# Patient Record
Sex: Male | Born: 1935 | Race: White | Hispanic: No | Marital: Single | Smoking: Never smoker
Health system: Southern US, Community
[De-identification: ages and names within clinical notes are randomized; demographics above are authoritative.]

## PROBLEM LIST (undated history)

## (undated) DIAGNOSIS — I10 Essential (primary) hypertension: Secondary | ICD-10-CM

## (undated) DIAGNOSIS — E785 Hyperlipidemia, unspecified: Secondary | ICD-10-CM

## (undated) DIAGNOSIS — M419 Scoliosis, unspecified: Secondary | ICD-10-CM

## (undated) DIAGNOSIS — K635 Polyp of colon: Secondary | ICD-10-CM

## (undated) HISTORY — DX: Polyp of colon: K63.5

## (undated) HISTORY — DX: Hyperlipidemia, unspecified: E78.5

## (undated) HISTORY — PX: HIATAL HERNIA REPAIR: SHX195

## (undated) HISTORY — DX: Scoliosis, unspecified: M41.9

## (undated) HISTORY — PX: APPENDECTOMY: SHX54

## (undated) HISTORY — PX: CATARACT EXTRACTION: SUR2

## (undated) HISTORY — DX: Essential (primary) hypertension: I10

---

## 1968-04-08 HISTORY — PX: INGUINAL HERNIA REPAIR: SHX194

## 1999-01-17 ENCOUNTER — Encounter (INDEPENDENT_AMBULATORY_CARE_PROVIDER_SITE_OTHER): Payer: Self-pay | Admitting: Specialist

## 1999-01-17 ENCOUNTER — Other Ambulatory Visit: Admission: RE | Admit: 1999-01-17 | Discharge: 1999-01-17 | Payer: Self-pay | Admitting: Gastroenterology

## 2004-11-08 ENCOUNTER — Ambulatory Visit: Payer: Self-pay | Admitting: Internal Medicine

## 2005-02-15 ENCOUNTER — Ambulatory Visit: Payer: Self-pay | Admitting: Internal Medicine

## 2005-03-19 ENCOUNTER — Ambulatory Visit: Payer: Self-pay | Admitting: Internal Medicine

## 2005-06-06 ENCOUNTER — Ambulatory Visit: Payer: Self-pay | Admitting: Internal Medicine

## 2005-11-11 ENCOUNTER — Ambulatory Visit: Payer: Self-pay | Admitting: Internal Medicine

## 2006-12-15 ENCOUNTER — Ambulatory Visit: Payer: Self-pay | Admitting: Internal Medicine

## 2006-12-15 LAB — CONVERTED CEMR LAB
CO2: 29 meq/L (ref 19–32)
Chloride: 109 meq/L (ref 96–112)
Eosinophils Absolute: 0.1 10*3/uL (ref 0.0–0.6)
Eosinophils Relative: 1.5 % (ref 0.0–5.0)
GFR calc non Af Amer: 88 mL/min
Glucose, Bld: 112 mg/dL — ABNORMAL HIGH (ref 70–99)
HCT: 43.3 % (ref 39.0–52.0)
Lymphocytes Relative: 21.9 % (ref 12.0–46.0)
MCV: 88.8 fL (ref 78.0–100.0)
Neutrophils Relative %: 65.8 % (ref 43.0–77.0)
RBC: 4.88 M/uL (ref 4.22–5.81)
Sodium: 144 meq/L (ref 135–145)
Total CHOL/HDL Ratio: 2.5
WBC: 6.6 10*3/uL (ref 4.5–10.5)

## 2008-01-21 ENCOUNTER — Ambulatory Visit: Payer: Self-pay | Admitting: Internal Medicine

## 2008-01-21 DIAGNOSIS — I10 Essential (primary) hypertension: Secondary | ICD-10-CM

## 2008-01-21 DIAGNOSIS — Z860100 Personal history of colon polyps, unspecified: Secondary | ICD-10-CM | POA: Insufficient documentation

## 2008-01-21 DIAGNOSIS — E785 Hyperlipidemia, unspecified: Secondary | ICD-10-CM | POA: Insufficient documentation

## 2008-01-21 DIAGNOSIS — M4120 Other idiopathic scoliosis, site unspecified: Secondary | ICD-10-CM | POA: Insufficient documentation

## 2008-01-28 ENCOUNTER — Telehealth (INDEPENDENT_AMBULATORY_CARE_PROVIDER_SITE_OTHER): Payer: Self-pay | Admitting: *Deleted

## 2008-01-29 LAB — CONVERTED CEMR LAB: Vit D, 1,25-Dihydroxy: 18 — ABNORMAL LOW (ref 30–89)

## 2008-06-01 ENCOUNTER — Encounter (INDEPENDENT_AMBULATORY_CARE_PROVIDER_SITE_OTHER): Payer: Self-pay | Admitting: *Deleted

## 2008-06-17 ENCOUNTER — Ambulatory Visit: Payer: Self-pay | Admitting: Gastroenterology

## 2008-07-07 ENCOUNTER — Ambulatory Visit: Payer: Self-pay | Admitting: Gastroenterology

## 2008-07-07 ENCOUNTER — Encounter: Payer: Self-pay | Admitting: Gastroenterology

## 2008-07-11 ENCOUNTER — Encounter: Payer: Self-pay | Admitting: Gastroenterology

## 2009-01-20 ENCOUNTER — Ambulatory Visit: Payer: Self-pay | Admitting: Internal Medicine

## 2009-01-20 DIAGNOSIS — R9431 Abnormal electrocardiogram [ECG] [EKG]: Secondary | ICD-10-CM

## 2009-01-20 LAB — CONVERTED CEMR LAB
AST: 19 units/L (ref 0–37)
Albumin: 4.1 g/dL (ref 3.5–5.2)
Alkaline Phosphatase: 64 units/L (ref 39–117)
Basophils Absolute: 0.1 10*3/uL (ref 0.0–0.1)
Bilirubin, Direct: 0.1 mg/dL (ref 0.0–0.3)
CO2: 28 meq/L (ref 19–32)
CRP, High Sensitivity: 4.2 (ref 0.00–5.00)
Calcium: 8.7 mg/dL (ref 8.4–10.5)
Creatinine, Ser: 1 mg/dL (ref 0.4–1.5)
Eosinophils Absolute: 0.1 10*3/uL (ref 0.0–0.7)
GFR calc non Af Amer: 77.31 mL/min (ref >60)
Glucose, Bld: 112 mg/dL — ABNORMAL HIGH (ref 70–99)
HDL: 46.4 mg/dL (ref >39.00)
Hemoglobin: 15.3 g/dL (ref 13.0–17.0)
Ketones, ur: NEGATIVE mg/dL
Lymphocytes Relative: 18.6 % (ref 12.0–46.0)
MCHC: 34.6 g/dL (ref 30.0–36.0)
Monocytes Relative: 6.7 % (ref 3.0–12.0)
Neutro Abs: 4.4 10*3/uL (ref 1.4–7.7)
Neutrophils Relative %: 71.7 % (ref 43.0–77.0)
RDW: 12.7 % (ref 11.5–14.6)
Sodium: 140 meq/L (ref 135–145)
Specific Gravity, Urine: 1.025 (ref 1.000–1.030)
Total Bilirubin: 0.7 mg/dL (ref 0.3–1.2)
Total CHOL/HDL Ratio: 3
Total Protein, Urine: NEGATIVE mg/dL
Triglycerides: 93 mg/dL (ref 0.0–149.0)
Urine Glucose: NEGATIVE mg/dL
Urobilinogen, UA: 0.2 (ref 0.0–1.0)
VLDL: 18.6 mg/dL (ref 0.0–40.0)
pH: 6 (ref 5.0–8.0)

## 2009-02-06 ENCOUNTER — Ambulatory Visit (HOSPITAL_COMMUNITY): Admission: RE | Admit: 2009-02-06 | Discharge: 2009-02-06 | Payer: Self-pay | Admitting: Internal Medicine

## 2009-02-06 ENCOUNTER — Ambulatory Visit: Payer: Self-pay

## 2009-02-06 ENCOUNTER — Ambulatory Visit: Payer: Self-pay | Admitting: Internal Medicine

## 2009-02-06 ENCOUNTER — Encounter: Payer: Self-pay | Admitting: Internal Medicine

## 2009-07-19 ENCOUNTER — Ambulatory Visit: Payer: Self-pay | Admitting: Internal Medicine

## 2009-08-01 ENCOUNTER — Encounter: Payer: Self-pay | Admitting: Internal Medicine

## 2010-01-23 ENCOUNTER — Ambulatory Visit: Payer: Self-pay | Admitting: Internal Medicine

## 2010-01-23 ENCOUNTER — Encounter: Payer: Self-pay | Admitting: Internal Medicine

## 2010-01-23 DIAGNOSIS — E559 Vitamin D deficiency, unspecified: Secondary | ICD-10-CM | POA: Insufficient documentation

## 2010-01-23 LAB — CONVERTED CEMR LAB
ALT: 28 units/L (ref 0–53)
AST: 21 units/L (ref 0–37)
BUN: 22 mg/dL (ref 6–23)
Basophils Relative: 1.4 % (ref 0.0–3.0)
Bilirubin, Direct: 0.1 mg/dL (ref 0.0–0.3)
CRP, High Sensitivity: 6.36 — ABNORMAL HIGH (ref 0.00–5.00)
Calcium: 9.6 mg/dL (ref 8.4–10.5)
Cholesterol: 164 mg/dL (ref 0–200)
Eosinophils Absolute: 0.1 10*3/uL (ref 0.0–0.7)
Eosinophils Relative: 2.1 % (ref 0.0–5.0)
GFR calc non Af Amer: 83.84 mL/min (ref >60)
Glucose, Bld: 111 mg/dL — ABNORMAL HIGH (ref 70–99)
HCT: 43.7 % (ref 39.0–52.0)
Leukocytes, UA: NEGATIVE
Lymphs Abs: 1.3 10*3/uL (ref 0.7–4.0)
MCHC: 34.5 g/dL (ref 30.0–36.0)
MCV: 91.3 fL (ref 78.0–100.0)
Monocytes Absolute: 0.6 10*3/uL (ref 0.1–1.0)
Nitrite: NEGATIVE
Platelets: 227 10*3/uL (ref 150.0–400.0)
Sodium: 143 meq/L (ref 135–145)
Specific Gravity, Urine: >=1.03 (ref 1.000–1.030)
TSH: 2.97 microintl units/mL (ref 0.35–5.50)
Total Bilirubin: 0.6 mg/dL (ref 0.3–1.2)
Urobilinogen, UA: 0.2 (ref 0.0–1.0)
WBC: 6.8 10*3/uL (ref 4.5–10.5)
pH: 5.5 (ref 5.0–8.0)

## 2010-01-24 LAB — CONVERTED CEMR LAB: Vit D, 25-Hydroxy: 20 ng/mL — ABNORMAL LOW (ref 30–89)

## 2010-05-06 LAB — CONVERTED CEMR LAB
ALT: 21 units/L (ref 0–53)
Alkaline Phosphatase: 65 units/L (ref 39–117)
Basophils Absolute: 0.1 10*3/uL (ref 0.0–0.1)
Bilirubin, Direct: 0.2 mg/dL (ref 0.0–0.3)
CO2: 25 meq/L (ref 19–32)
Glucose, Bld: 130 mg/dL — ABNORMAL HIGH (ref 70–99)
HDL: 51.9 mg/dL (ref >39.0)
LDL Cholesterol: 74 mg/dL (ref 0–99)
Lymphocytes Relative: 21.2 % (ref 12.0–46.0)
Monocytes Relative: 7.9 % (ref 3.0–12.0)
Neutrophils Relative %: 66.8 % (ref 43.0–77.0)
Platelets: 227 10*3/uL (ref 150–400)
Potassium: 4.3 meq/L (ref 3.5–5.1)
RDW: 13.1 % (ref 11.5–14.6)
Sodium: 141 meq/L (ref 135–145)
Total Bilirubin: 0.9 mg/dL (ref 0.3–1.2)
Total CHOL/HDL Ratio: 2.8
Total Protein: 7 g/dL (ref 6.0–8.3)
Triglycerides: 94 mg/dL (ref 0–149)
VLDL: 19 mg/dL (ref 0–40)

## 2010-05-08 NOTE — Miscellaneous (Signed)
Summary: Orders Update  Clinical Lists Changes  Orders: Added new Test order of T-2 View CXR (71020TC) - Signed 

## 2010-05-08 NOTE — Miscellaneous (Signed)
Summary: Declaration Of A Desire For A Natural Death  Declaration Of A Desire For A Natural Death   Imported By: Sherian Rein 08/23/2009 08:03:58  _____________________________________________________________________  External Attachment:    Type:   Image     Comment:   External Document

## 2010-05-08 NOTE — Assessment & Plan Note (Signed)
Summary: Primary svc/ cpx    Primary Provider/Referring Provider:  Sherene Sires  CC:  cpx fasting.  History of Present Illness: 71  yowm never smoker  with hypertension and hyperlipidemia, associated with moderate centripetal obesity, with the target weight of 161 pounds.    January 21, 2008 ov for CPX concerned mostly with weight gain.  January 20, 2009  ov f/u hbp/ high chol and wt issues, not making any progress.    July 19, 2009 6 month followup.  Pt states doing well and denies complaints today. Last ldl 73 on lipitor 10 and very active playing tennis no cp, tia, sob, claudication. ok to reduce lipitor to 10 mg one half daily  January 23, 2010 cpx   cc shoulder pain getting better with asa or tylenol. Pt denies any significant sore throat, dysphagia, itching, sneezing,  nasal congestion or excess secretions,  fever, chills, sweats, unintended wt loss, pleuritic or exertional cp, hempoptysis, change in activity tolerance  orthopnea pnd or leg swelling   Current Medications (verified): 1)  Amlodipine Besylate 5 Mg Tabs (Amlodipine Besylate) .... Take 1 Tablet By Mouth Once A Day 2)  Aspirin Adult Low Strength 81 Mg Tbec (Aspirin) .[name].. 1 Once Daily 3)  Lipitor 10 Mg Tabs (Atorvastatin Calcium) .... 1/2 Once Daily  Allergies (verified): No Known Drug Allergies  Past History:  Past Medical History: SCOLIOSIS (ICD-737.30) COLONIC POLYPS, BENIGN, HX OF (ICD-V12.72)..............................Marland KitchenArlyce Dice    -  Colonsocpopy 06/16/03    -  Repeat colonsocopy 4/1/110 1) Two polyps in the descending colon                                                      2) 3 mm sessile polyp in the sigmoid colon                                                       3) Diverticula, scattered in the mid transverse colon                                                       4 ) Path no sign dysplaisa HYPERTENSION (ICD-401.9) HYPERLIPIDEMIA (ICD-272.4)   -  target  < 130 HBP/male/no fm hx  HEALTH  MAINTENANCE........................................................................Marland KitchenWert     -Td 02/2005     - Pneumovax 12/1998 (age 43)      - CPX  January 23, 2010    Family History: only child mother died at 66 father parkinsons dz no premature heart dz  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 Afeb with normal vital signs HEENT: nl dentition, turbinates, and orophanx. Nl external ear canals without cough reflex Neck without JVD/Nodes/TM/carotids brisk without bruit Lungs clear to A and P bilaterally without cough on insp or exp maneuvers/ mod scoliosis RRR no s3 or murmur or increase in P2/ no displacement pmi Abd soft and benign with nl excursion in the supine position. No bruits or organomegaly Ext warm without calf tenderness, cyanosis clubbing or edema GU  testes down bilaterally.  No IH Rectal mild bph, stool gneg MS  Scoliosis  present, minimal effect on gait, no joint restrictions      Vitamin D (25-Hydroxy)                        [L]  20 ng/mL                    30-89        Total Bilirubin           0.6 mg/dL                   1.6-1.0   Direct Bilirubin          0.1 mg/dL                   9.6-0.4   Alkaline Phosphatase      67 U/L                      39-117   AST                       21 U/L                      0-37   ALT                       28 U/L                       0-53   Total Protein             6.9 g/dL                    5.4-0.9   Albumin                   4.2 g/dL                    8.1-1.9  Tests: (2) CBC Platelet w/Diff (CBCD)   White Cell Count          6.8 K/uL                    4.5-10.5   Red Cell Count            4.79 Mil/uL                 4.22-5.81   Hemoglobin  15.1 g/dL                   81.1-91.4   Hematocrit                43.7 %                      39.0-52.0   MCV                       91.3 fl                     78.0-100.0   MCHC                      34.5 g/dL                   78.2-95.6   RDW                       13.8 %                      11.5-14.6   Platelet Count            227.0 K/uL                  150.0-400.0   Neutrophil %              68.8 %                      43.0-77.0   Lymphocyte %              19.1 %                      12.0-46.0   Monocyte %                8.6 %                       3.0-12.0   Eosinophils%              2.1 %                       0.0-5.0   Basophils %               1.4 %                       0.0-3.0   Neutrophill Absolute      4.7 K/uL                    1.4-7.7   Lymphocyte Absolute       1.3 K/uL                    0.7-4.0   Monocyte Absolute         0.6 K/uL                    0.1-1.0  Eosinophils, Absolute                             0.1 K/uL                    0.0-0.7  Basophils Absolute        0.1 K/uL                    0.0-0.1  Tests: (3) BMP (METABOL)   Sodium                    143 mEq/L                   135-145   Potassium                 4.6 mEq/L                   3.5-5.1   Chloride                  109 mEq/L                   96-112   Carbon Dioxide            27 mEq/L                    19-32   Glucose              [H]  111 mg/dL                   16-10   BUN                       22 mg/dL                    9-60   Creatinine                0.9 mg/dL                   4.5-4.0   Calcium                   9.6 mg/dL                    9.8-11.9   GFR                       83.84 mL/min                >60  Tests: (4) Lipid Panel (LIPID)   Cholesterol               164 mg/dL                   1-478     ATP III Classification            Desirable:  < 200 mg/dL                    Borderline High:  200 - 239 mg/dL               High:  > = 240 mg/dL   Triglycerides             102.0 mg/dL                 2.9-562.1     Normal:  <150 mg/dL     Borderline High:  308 - 199 mg/dL   HDL                       65.78  mg/dL                 >04.54   VLDL Cholesterol          20.4 mg/dL                  0.9-81.1   LDL Cholesterol           87 mg/dL                    9-14  CHO/HDL Ratio:  CHD Risk                             3                    Men          Women     1/2 Average Risk     3.4          3.3     Average Risk          5.0          4.4     2X Average Risk          9.6          7.1     3X Average Risk          15.0          11.0                           Tests: (5) TSH (TSH)   FastTSH                   2.97 uIU/mL                 0.35-5.50  Tests: (6) Full Range CRP (FCRP)   CRPH                 [H]  6.36 mg/L                   0.00-5.00     Note:  An elevated hs-CRP (>5 mg/L) should be repeated after 2 weeks to rule out recent infection or trauma.  Tests: (7) UDip Only (UDIP)   Color                     LT. YELLOW       RANGE:  Yellow;Lt. Yellow   Clarity                   CLEAR                       Clear   Specific Gravity          >=1.030                     1.000 - 1.030   Urine Ph                  5.5                         5.0-8.0   Protein                   NEGATIVE  Negative   Urine Glucose             NEGATIVE                    Negative   Ketones                   NEGATIVE                    Negative   Urine Bilirubin           NEGATIVE                    Negative   Blood                     NEGATIVE                    Negative   Urobilinogen              0.2                          0.0 - 1.0   Leukocyte Esterace        NEGATIVE                    Negative   Nitrite                   NEGATIVE                    Negative  CXR  Procedure date:  01/23/2010  Findings:       Comparison: 01/20/2009   Findings: Heart and mediastinal contours are within normal limits. No focal opacities or effusions.  No acute bony abnormality. Stable deformity of the left upper chest wall.   IMPRESSION: No active disease or change.    Impression & Recommendations:  Problem # 1:  HYPERLIPIDEMIA (ICD-272.4) target  < 130 HBP/male/no fm hx  His updated medication list for this problem includes:    Lipitor 10 Mg Tabs (Atorvastatin calcium) .[name]... 1/2 once daily  Labs Reviewed: SGOT: 19 (01/20/2009)   SGPT: 25 (01/20/2009)   HDL:46.40 (01/20/2009), 51.9 (01/21/2008)  LDL:73 (01/20/2009), 74 (01/21/2008)>  LDL 87 January 23, 2010  so at target on 5 mg/day   Chol:138 (01/20/2009), 145 (01/21/2008)  Trig:93.0 (01/20/2009), 94 (01/21/2008)  Problem # 2:  HYPERTENSION (ICD-401.9)  His updated medication list for this problem includes:    Amlodipine Besylate 5 Mg Tabs (Amlodipine besylate) .[name]... Take 1 tablet by mouth once a day   ok on rx  Problem # 3:  COLONIC POLYPS, BENIGN, HX OF (ICD-V12.72) Colonoscopy reviewed  Problem # 4:  SCOLIOSIS (ICD-737.30) vit d def noted  see next problem  Problem # 5:  VITAMIN D DEFICIENCY (ICD-268.9) Called  report:  Vit D def, rx with 50 k twice weekly for 12 weeks, then recheck level    Medications Added to Medication List This Visit: 1)  Lipitor 10 Mg Tabs (Atorvastatin calcium) .... 1/2 once daily  Other Orders: T-Vitamin D (25-Hydroxy) [phone]) EKG w/ Interpretation (93000) TLB-Hepatic/Liver Function Pnl (80076-HEPATIC) TLB-CBC Platelet - w/Differential (85025-CBCD) TLB-BMP (Basic Metabolic Panel-BMET) (80048-METABOL) TLB-Lipid Panel (80061-LIPID) TLB-TSH (Thyroid Stimulating Hormone) (84443-TSH) TLB-CRP-High Sensitivity  (C-Reactive Protein) (86140-FCRP) TLB-Udip ONLY (81003-UDIP) Flu Vaccine 62yrs + (59563) Administration Flu vaccine - MCR (G0008) Est. Patient 65& > (87564)  Patient Instructions: 1)  Call [phone] for your results w/in next 3 days - if there's something important  I feel you need to know,  I'll be in touch with you directly. 2)  Return to office in 6  months, sooner if needed        Flu Vaccine Consent Questions     Do you have a history of severe allergic reactions to this vaccine? no    Any prior history of allergic reactions to egg and/or gelatin? no    Do you have a sensitivity to the preservative Thimersol? no    Do you have a past history of Guillan-Barre Syndrome? no    Do you currently have an acute febrile illness? no    Have you ever had a severe reaction to latex? no    Vaccine information given and explained to patient? yes    Are you currently pregnant? no    Lot [id number]   Exp Date:07/06/2009   Manufacturer: [employer]    Site Given  Left Deltoid IMlu Vernie Murders  January 23, 2010 9:44 AM

## 2010-05-08 NOTE — Assessment & Plan Note (Signed)
Summary: Primary svc/ f/u ov   Primary Provider/Referring Provider:  Sherene Sires  CC:  6 month followup.  Pt states doing well and denies complaints today.[name]  History of Present Illness: 77  yo white male with hypertension and hyperlipidemia, associated with moderate centripetal obesity, with the target weight of 161 pounds.    January 21, 2008 ov for CPX concerned mostly with weight gain.  January 20, 2009  ov f/u hbp/ high chol and wt issues, not making any progress.    July 19, 2009 6 month followup.  Pt states doing well and denies complaints today. Last ldl 73 on lipitor 10 and very active playing tennis no cp, tia, sob, claudication. Pt denies any significant sore throat, dysphagia, itching, sneezing,  nasal congestion or excess secretions,  fever, chills, sweats, unintended wt loss, pleuritic or exertional cp, hempoptysis, change in activity tolerance  orthopnea pnd or leg swelling   Current Medications (verified): 1)  Amlodipine Besylate 5 Mg Tabs (Amlodipine Besylate) .... Take 1 Tablet By Mouth Once A Day 2)  Aspirin Adult Low Strength 81 Mg Tbec (Aspirin) .[name].. 1 Once Daily 3)  Lipitor 10 Mg Tabs (Atorvastatin Calcium) .... Take 1 Tablet By Mouth Once A Day  Allergies (verified): No Known Drug Allergies  Past History:  Past Medical History: SCOLIOSIS (ICD-737.30) COLONIC POLYPS, BENIGN, HX OF (ICD-V12.72)..............................Marland KitchenArlyce Dice    -  Colonsocpopy 06/16/03    -  Repeat colonsocopy 4/1/110 1) Two polyps in the descending colon                                                      2) 3 mm sessile polyp in the sigmoid colon                                                       3) Diverticula, scattered in the mid transverse colon                                                       4 ) Path no sign dysplaisa HYPERTENSION (ICD-401.9) HYPERLIPIDEMIA (ICD-272.4)   -  target  < 130 HBP/male/no fm hx  HEALTH  MAINTENANCE........................................................................Marland KitchenWert     -Td 11/06     - Pneumovax 12/1998 (age 29)      - CPX  January 20, 2009   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 Afeb with normal vital signs HEENT: nl dentition, turbinates, and orophanx. Nl external ear canals without cough reflex Neck without JVD/Nodes/TM/carotids brisk without bruit Lungs clear to A and P bilaterally without cough on insp or exp maneuvers/ mod scoliosis RRR no s3 or murmur or increase in P2/ no displacement pmi Abd soft and benign with nl excursion in the supine position. No bruits or organomegaly Ext warm without calf tenderness, cyanosis clubbing or edema    Impression & Recommendations:  Problem # 1:  HYPERTENSION (ICD-401.9)  ok on rx His updated medication list for this problem includes:    Amlodipine Besylate 5 Mg Tabs (Amlodipine besylate) .[name]... Take 1 tablet by mouth once a day  Orders: Est. Patient Level III (16109)  Problem # 2:  HYPERLIPIDEMIA (ICD-272.4)    -  target  < 130 HBP/male/no fm hx  His updated medication list for this problem includes:    Lipitor 10 Mg Tabs (Atorvastatin calcium) .[name]... Take 1 tablet by mouth once a day  Labs Reviewed: SGOT: 19 (01/20/2009)   SGPT: 25 (01/20/2009)   HDL:46.40 (01/20/2009), 51.9 (01/21/2008)  LDL:73 (01/20/2009), 74 (01/21/2008)  Chol:138 (01/20/2009), 145 (01/21/2008)  Trig:93.0  (01/20/2009), 94 (01/21/2008)  so ok to reduce to 5 mg per day as long as no wt gain  Orders: Est. Patient Level III (60454)  Patient Instructions: 1)  Weight control is simply a matter of calorie balance which needs to be tilted in your favor by eating less and exercising more.  To get the most out of exercise, you need to be continuously aware that you are short of breath, but never out of breath, for 30 minutes daily. As you improve, it will actually be easier for you to do the same amount in  30 minutes so always push to the level where you are short of breath.  If this does not result in gradual weight reduction,  I recommend  a nutritionist for a food diary 2)  ok to reduce lipitor to one half daily 3)  Return for cpx 01/2010

## 2010-05-08 NOTE — Miscellaneous (Signed)
Summary: Health Care POA  Health Care POA   Imported By: Sherian Rein 08/23/2009 08:02:18  _____________________________________________________________________  External Attachment:    Type:   Image     Comment:   External Document

## 2010-08-20 ENCOUNTER — Encounter: Payer: Self-pay | Admitting: Internal Medicine

## 2010-08-21 ENCOUNTER — Ambulatory Visit (INDEPENDENT_AMBULATORY_CARE_PROVIDER_SITE_OTHER): Payer: Medicare Other | Admitting: Internal Medicine

## 2010-08-21 ENCOUNTER — Encounter: Payer: Self-pay | Admitting: Internal Medicine

## 2010-08-21 DIAGNOSIS — E785 Hyperlipidemia, unspecified: Secondary | ICD-10-CM

## 2010-08-21 DIAGNOSIS — I10 Essential (primary) hypertension: Secondary | ICD-10-CM

## 2010-08-21 DIAGNOSIS — R21 Rash and other nonspecific skin eruption: Secondary | ICD-10-CM | POA: Insufficient documentation

## 2010-08-21 MED ORDER — CLOTRIMAZOLE-BETAMETHASONE 1-0.05 % EX CREA: TOPICAL_CREAM | CUTANEOUS | Status: DC

## 2010-08-21 MED ORDER — AMLODIPINE BESYLATE-VALSARTAN 5-160 MG PO TABS: 1.0000 | ORAL_TABLET | Freq: Every day | ORAL | Status: DC

## 2010-08-21 NOTE — Patient Instructions (Addendum)
Stop amlodipine Start exforge 160/5  One each am Lotrisone cream twice daily to rash x 2weeks or until gone Please schedule a follow up office visit in 2  weeks, sooner if needed

## 2010-08-21 NOTE — Progress Notes (Signed)
  77 yowm never smoker with hypertension and  hyperlipidemia, associated with moderate centripetal obesity, with the  target weight of 161 pounds.   January 21, 2008 ov for CPX concerned mostly with weight gain.   January 20, 2009 ov f/u hbp/ high chol and wt issues, not making any progress.   July 19, 2009 6 month followup. Pt states doing well and denies complaints today. Last ldl 73 on lipitor 10 and very active playing tennis no cp, tia, sob, claudication. ok to reduce lipitor to 10 mg one half daily   January 23, 2010 cpx cc shoulder pain getting better with asa or tylenol.    08/21/2010 ov/[place] f/u hbp, watching salt and getting plenty of exercise.  Two red lesions both legs   Pt denies any significant sore throat, dysphagia, itching, sneezing,  nasal congestion or excess/ purulent secretions,  fever, chills, sweats, unintended wt loss, pleuritic or exertional cp, hempoptysis, orthopnea pnd or leg swelling.    Also denies any obvious fluctuation of symptoms with weather or environmental changes or other aggravating or alleviating factors.      Past Medical History:  SCOLIOSIS (ICD-737.30)  COLONIC POLYPS, BENIGN, HX OF (ICD-V12.72)..............................Marland KitchenArlyce Dice  - Colonsocpopy 06/16/03  - Repeat colonsocopy 4/1/110 1) Two polyps in the descending colon  2) 3 mm sessile polyp in the sigmoid colon  3) Diverticula, scattered in the mid transverse colon  4 ) Path no sign dysplaisa  HYPERTENSION (ICD-401.9)  HYPERLIPIDEMIA (ICD-272.4)  - target < 130 HBP/male/no fm hx  HEALTH MAINTENANCE........................................................................Marland KitchenWert  -Td 02/2005  - Pneumovax 12/1998 (age 77)  - CPX January 23, 2010   Family History:  only child  mother died at 66  father parkinsons dz  no premature heart dz   Social History:  Retired from newspaper  Occ ETOH  Never smoker  Lives in retirement center [place], eats buffet food , plays lots of tennis     Physical exam    167 - 173 January 21, 2008 > 172 January 20, 2009 > 172 July 19, 2009 > 171 January 23, 2010 > 172 08/21/2010  [name] in no acute distress.   HEENT: nl dentition, turbinates, and orophanx. Nl external ear canals without cough reflex  Neck without JVD/Nodes/TM/carotids brisk without bruit  Lungs clear to A and P bilaterally without cough on insp or exp maneuvers/ mod scoliosis  RRR no s3 or murmur or increase in P2/ no displacement pmi  Abd soft and benign with nl excursion in the supine position. No bruits or organomegaly  Ext warm without calf tenderness, cyanosis clubbing or edema  MS Scoliosis present, minimal effect on gait, no joint restrictions Skin:  Two scaly red lesions one  Each calf

## 2010-08-21 NOTE — Assessment & Plan Note (Signed)
-   Try lotrisone 08/21/2010  > refer to [facility] ([facility]) if not responding

## 2010-08-21 NOTE — Assessment & Plan Note (Signed)
At goal on lipitor, reviewed with pt, no problem with lft's or aches so no change rx

## 2010-08-21 NOTE — Assessment & Plan Note (Signed)
[facility]                             PULMONARY OFFICE NOTE   NAME:Bruce Dixon, Bruce Dixon                       MRN:          [id number]  DATE:12/15/2006                            DOB:          1931-10-18    PRIMARY SERVICE COMPREHENSIVE HEALTH CARE EVALUATION:   HISTORY:  This is a 77 year old white male with hypertension and  hyperlipidemia, associated with moderate centripetal obesity, with the  target weight of 161 pounds.  He has made slow progress in terms of  weight-loss, down now to 167, and is able to play tennis three to four  times a week.  He denies any exertional chest pain, orthopnea, PND or  leg-swelling, TIA or claudication symptoms.   PAST MEDICAL HISTORY:  1. Benign polyp/diverticulosis.  Most recent colonoscopy was reviewed,      March 2007.  2. Hyperlipidemia with target LDL less than 130, based on male gender.  3. Hypertension.  4. Scoliosis with minimal restrictive element.   ALLERGIES:  None known.   MEDICATIONS:  1. Caduet 5/10 one daily.  2. Aspirin 81 mg daily.  3. Citrucel one teaspoon daily (which he frequently forgets to take).  4. Motrin p.r.n. arthritis pain in the hip or shoulders.  Rarely      exceeds three or four a day.   SOCIAL HISTORY:  He occasionally has a glass of wine.  He is retired  from newspaper administration.  He never married.  He has never smoked.  He denies any excess alcohol use.   FAMILY HISTORY:  Positive for longevity in that his mother lived to be  79.  His father died of Parkinson's disease.  He is the only child.   REVIEW OF SYSTEMS:  Taken in detail on the work sheet and negative,  except as outlined above.   PHYSICAL EXAMINATION:  This is a pleasant, ambulatory, white male, in no  acute distress, weighing 167, which is up from a year ago of 6 pounds.  Blood pressure 140/78 after taking his morning medicines.  HEENT:  Reveals oropharynx clear.  Dentition intact.  Nasal turbinates  within normal limits.  Funduscopy revealed no obvious retinal arterial  change.  Ear canals clear bilaterally.  NECK:  Supple without cervical adenopathy or tenderness.  Trachea is  midline without thyromegaly.  Carotid upstrokes were brisk, without  bruits.  CHEST:  Clear bilaterally to auscultation and percussion, although there  was some mild kyphosis.  There was regular rate and rhythm without murmur, gallop or rub.  No  displacement of PMI or increased P2.  ABDOMEN:  Soft without palpable organomegaly, masses or tenderness.  Femoral pulses were present without bruits.  GENITOURINARY:  Testes are descended bilaterally, no nodules.  RECTAL:  Revealed mild benign prostatic hypertrophy, stool guaiac was  negative.  EXTREMITIES:  Warm without calf tenderness, cyanosis, clubbing or edema.  Pedal pulses were strong in the dorsalis pedis and the posterior tibial  distribution bilaterally in symmetric fashion.  NEUROLOGIC:  No focal deficits or pathologic reflexes.  SKIN EXAM:  Warm and dry.  MUSCULOSKELETAL EXAM:  Unremarkable.  LABORATORY DATA:  Included a normal CBC, chemistry profile, LDL  cholesterol of only 63 with an HDL of 58 and TSH was normal.  CRP was 1.  EKG and chest x-ray were normal.   IMPRESSION:  1. Hypertension is under adequate control on present regimen.  2. Hyperlipidemia, also under adequate control on Caduet with no      evidence of adverse drug effect.  3. History of mild degenerative arthritis of the shoulders, controlled      with p.r.n. Motrin.  4. Colon polyps/diverticulosis noted.  I emphasized the importance of      extra bran in his diet if he will not comply with the      recommendation for Citrucel.  Noted this is in the computer for      recall colonoscopy in 2010.  5. Scoliosis with no evidence of osteoporosis or significant      restriction in terms of activities.  6. General health maintenance:  He was updated with tetanus in 2006.       Pneumovax in 2000.  Does receive a flu vaccination yearly.   FOLLOWUP:  We will see the patient back yearly, in the meantime p.r.n.     Charlaine Dalton. Sherene Sires, MD, [facility]  Electronically Signed    MBW/MedQ  DD: 12/17/2006  DT: 12/17/2006  Job #: [id number]

## 2010-08-21 NOTE — Assessment & Plan Note (Signed)
I had an extended discussion with the patient today lasting 15 to 20 minutes of a 25 minute visit on the following issues:   He is already on amlodipine, has no edema and watching salt, ex, so next step is add arb with goal spb <135 and dbp <85 but need to monitor renal function in 2 weeks

## 2010-08-24 NOTE — Assessment & Plan Note (Signed)
[facility]                               PULMONARY OFFICE NOTE   NAME:Bruce Dixon, Bruce Dixon                       MRN:          [id number]  DATE:11/12/2005                            DOB:          July 09, 1931    HISTORY:  A 77 year old white male, never smoker, with borderline  hypertension, hyperlipidemia, moderate obesity, presents for follow up  evaluation of these problems, stating that he is able to exercise three to  four times a week, playing exercise with his neighbors at the retirement  home and enjoying life to the fullest.  He denies any exertional chest  pain, orthopnea, PND, TIA, or claudication symptoms or significant leg  swelling.  His present regiment which consists of Caduet 5/10 one daily and  a baby aspirin one daily.   PAST MEDICAL HISTORY:  1.  Benign polyp for diverticulosis, most recent colonoscopy dated March      2005.  2.  Hyperlipidemia, LDL target less than 130, based on male gender and      hypertension.  3.  Scoliosis with minimal restrictive element.   ALLERGIES:  No known drug allergies.   MEDICATIONS:  As above.   SOCIAL HISTORY:  He has an occasional glass of wine.  He is retired from [employer].  He had never married.  He has never smoked.  Denies any excess  alcohol use.   FAMILY HISTORY:  Significant for the fact that his mother lived to be 59,  father died of Parkinson's disease.  He is the only child.   REVIEW OF SYSTEMS:  Taken in detail on the worksheet, and significant only  for the problems as noted above.  He has occasional positional pain in his  shoulders that is resolved with Motrin and no radicular features or neck  pain.   PHYSICAL EXAMINATION:  GENERAL:  This is a robust, pleasant, ambulatory  white male who appears actually younger than stated age.  VITAL SIGNS:  He is afebrile.  HEENT:  Ocular exam was done with limited funduscopy which was normal.  Oropharynx was clear.  Dentition was  intact.  Ear canals clear bilaterally.  NECK:  Supple without cervical adenopathy or tenderness.  Trachea was  midline, no thyromegaly.  Carotid upstrokes were brisk, no bruits.  CHEST:  Clear to auscultation and percussion bilaterally.  HEART:  Regular rate and rhythm without murmurs, rubs, or gallops present.  ABDOMEN:  Soft, benign, no palpable organomegaly, masses, or tenderness.  GENITOURINARY:  Testes descended bilaterally, no nodules.  RECTAL:  Revealed mild BPH, stool texture was negative.  No prostatic  nodules.  EXTREMITIES:  Warm without calf tenderness, cyanosis, clubbing, or edema.   LABORATORY DATA:  Chemistry profile was normal.  TSH was normal.  CRP was  less than 1.  Urinalysis was unremarkable.  CBC was normal.  LDL cholesterol  was 77, with a HDL of 58.  EKG and chest x-ray were normal except for  scoliosis on the x-ray.   HEALTH MAINTENANCE:  He was updated on tetanus in 2006, and Pneumovax in  2000.  IMPRESSION:  1.  Hypertension is well controlled on Caduet with no evidence of secondary      organ damage.  2.  Hyperlipidemia with LDL at goal on Caduet, therefore, no change in      therapy with CRP less than 1 noted.  3.  Mild degenerative arthritis of the shoulders, well controlled with      Motrin p.r.n.  4.  Colon polyps and diverticulosis noted.  I recommended plenty of bran in      his diet or Citrucel one daily, and reviewed the results of his most      recent colonoscopy, June 16, 2003, with the plan to recall him in 2010.  5.  Scoliosis with no evidence of osteoporosis or significant restriction in      terms of his activities, although he does appear to have mild      restrictive changes on x-ray.   FOLLOWUP:  Every six months, sooner if needed.                                   Charlaine Dalton. Sherene Sires, MD, [facility]   MBW/MedQ  DD:  11/12/2005  DT:  11/12/2005  Job #:  [id number]

## 2010-09-06 ENCOUNTER — Ambulatory Visit (INDEPENDENT_AMBULATORY_CARE_PROVIDER_SITE_OTHER): Payer: Medicare Other | Admitting: Internal Medicine

## 2010-09-06 ENCOUNTER — Encounter: Payer: Self-pay | Admitting: Internal Medicine

## 2010-09-06 DIAGNOSIS — I10 Essential (primary) hypertension: Secondary | ICD-10-CM

## 2010-09-06 DIAGNOSIS — R21 Rash and other nonspecific skin eruption: Secondary | ICD-10-CM

## 2010-09-06 MED ORDER — AMLODIPINE BESYLATE-VALSARTAN 5-160 MG PO TABS: 1.0000 | ORAL_TABLET | Freq: Every day | ORAL | Status: DC

## 2010-09-06 NOTE — Progress Notes (Deleted)
  77 yowm never smoker with hypertension and  hyperlipidemia, associated with moderate centripetal obesity, with the  target weight of 161 pounds.   January 21, 2008 ov for CPX concerned mostly with weight gain.   January 20, 2009 ov f/u hbp/ high chol and wt issues, not making any progress.   July 19, 2009 6 month followup. Pt states doing well and denies complaints today. Last ldl 73 on lipitor 10 and very active playing tennis no cp, tia, sob, claudication. ok to reduce lipitor to 10 mg one half daily   January 23, 2010 cpx cc shoulder pain getting better with asa or tylenol.    08/21/2010 ov/Telissa Palmisano f/u hbp, watching salt and getting plenty of exercise.  Two red lesions both legs   Pt denies any significant sore throat, dysphagia, itching, sneezing,  nasal congestion or excess/ purulent secretions,  fever, chills, sweats, unintended wt loss, pleuritic or exertional cp, hempoptysis, orthopnea pnd or leg swelling.    Also denies any obvious fluctuation of symptoms with weather or environmental changes or other aggravating or alleviating factors.      Past Medical History:  SCOLIOSIS (ICD-737.30)  COLONIC POLYPS, BENIGN, HX OF (ICD-V12.72)..............................Marland KitchenArlyce Dice  - Colonsocpopy 06/16/03  - Repeat colonsocopy 4/1/110 1) Two polyps in the descending colon  2) 3 mm sessile polyp in the sigmoid colon  3) Diverticula, scattered in the mid transverse colon  4 ) Path no sign dysplaisa  HYPERTENSION (ICD-401.9)  HYPERLIPIDEMIA (ICD-272.4)  - target < 130 HBP/male/no fm hx  HEALTH MAINTENANCE........................................................................Marland KitchenWert  -Td 02/2005  - Pneumovax 12/1998 (age 60)  - CPX January 23, 2010   Family History:  only child  mother died at 91  father parkinsons dz  no premature heart dz   Social History:  Retired from newspaper  Occ E[employer]  Never smoker  Lives in retirement center [place], eats buffet food , plays lots of tennis

## 2010-09-06 NOTE — Progress Notes (Signed)
Subjective:     Patient ID: Bruce Dixon, male   DOB: 1931/12/10, 77 y.o.   MRN: [id number]  HPI   35 yowm never smoker with hypertension and  hyperlipidemia, associated with moderate centripetal obesity, with the  target weight of 161 pounds.   January 21, 2008 ov for CPX concerned mostly with weight gain.   January 20, 2009 ov f/u hbp/ high chol and wt issues, not making any progress.   July 19, 2009 6 month followup. Last ldl 73 on lipitor 10 and very active playing tennis no cp, tia, sob, claudication. ok to reduce lipitor to 10 mg one half daily   January 23, 2010 cpx cc shoulder pain getting better with asa or tylenol.   08/21/2010 ov/Bruce Dixon f/u hbp, watching salt and getting plenty of exercise. Two red lesions both legs  Stop amlodipine Start exforge 160/5  One each am Lotrisone cream twice daily to rash x 2weeks or until gone    09/06/2010 ov/Bruce Dixon f/u hbp only one of the two lesions resolved, the second one with lots of scaling ? AK.  No cp, tia or claudication. Pt denies any significant sore throat, dysphagia, itching, sneezing,  nasal congestion or excess/ purulent secretions,  fever, chills, sweats, unintended wt loss, pleuritic or exertional cp, hempoptysis, orthopnea pnd or leg swelling.    Also denies any obvious fluctuation of symptoms with weather or environmental changes or other aggravating or alleviating factors.        Past Medical History:  SCOLIOSIS (ICD-737.30)  COLONIC POLYPS, BENIGN, HX OF (ICD-V12.72)..............................Marland KitchenArlyce Dixon  - Colonsocpopy 06/16/03  - Repeat colonsocopy 4/1/110 1) Two polyps in the descending colon  2) 3 mm sessile polyp in the sigmoid colon  3) Diverticula, scattered in the mid transverse colon  4 ) Path no sign dysplaisa  HYPERTENSION (ICD-401.9)  HYPERLIPIDEMIA (ICD-272.4)  - target < 130 HBP/male/no fm hx  HEALTH MAINTENANCE........................................................................Marland KitchenWert  -Td 02/2005  -  Pneumovax 12/1998 (age 39)  - CPX January 23, 2010   Family History:  only child  mother died at 82  father parkinsons dz  no premature heart dz   Social History:  Retired from newspaper  Occ E[employer]  Never smoker  Lives in retirement center [place], eats buffet food , plays lots of tennis    09/06/2010 ov/Bruce Dixon   Review of Systems     Objective:   Physical Exam    wt 172 January 20, 2009 > 172 July 19, 2009 > 171 January 23, 2010 > 172 08/21/2010 > 170 09/06/10 [name] in no acute distress.   HEENT: nl dentition, turbinates, and orophanx. Nl external ear canals without cough reflex  Neck without JVD/Nodes/TM/carotids brisk without bruit  Lungs clear to A and P bilaterally without cough on insp or exp maneuvers/ mod scoliosis  RRR no s3 or murmur or increase in P2/ no displacement pmi  Abd soft and benign with nl excursion in the supine position. No bruits or organomegaly  Ext warm without calf tenderness, cyanosis clubbing or edema  MS Scoliosis present, minimal effect on gait, no joint restrictions Skin:  Two scaly red lesions one  Each calf Assessment:       Plan:

## 2010-09-06 NOTE — Patient Instructions (Signed)
Continue Exforge 160/5 one daily - let me know if insurance requesting substitute which one they prefer  Please see patient coordinator before you leave today  to schedule Derm evaluation  Return for CPX after January 24 2011

## 2010-09-07 ENCOUNTER — Encounter: Payer: Self-pay | Admitting: Internal Medicine

## 2010-09-07 NOTE — Assessment & Plan Note (Signed)
Adequate control on present rx, reviewed  

## 2010-09-07 NOTE — Assessment & Plan Note (Signed)
The lesion on the left leg may be AK > derm referral

## 2010-09-19 ENCOUNTER — Other Ambulatory Visit: Payer: Self-pay | Admitting: Dermatology

## 2010-12-18 ENCOUNTER — Other Ambulatory Visit: Payer: Self-pay | Admitting: Internal Medicine

## 2011-01-29 ENCOUNTER — Other Ambulatory Visit (INDEPENDENT_AMBULATORY_CARE_PROVIDER_SITE_OTHER): Payer: Medicare Other

## 2011-01-29 ENCOUNTER — Ambulatory Visit (INDEPENDENT_AMBULATORY_CARE_PROVIDER_SITE_OTHER)
Admission: RE | Admit: 2011-01-29 | Discharge: 2011-01-29 | Disposition: A | Discharge status: Home / Self Care | Payer: Medicare Other | Source: Ambulatory Visit | Attending: Internal Medicine | Admitting: Internal Medicine

## 2011-01-29 ENCOUNTER — Ambulatory Visit (INDEPENDENT_AMBULATORY_CARE_PROVIDER_SITE_OTHER): Payer: Medicare Other | Admitting: Internal Medicine

## 2011-01-29 ENCOUNTER — Encounter: Payer: Self-pay | Admitting: Internal Medicine

## 2011-01-29 VITALS — BP 132/70 | HR 75 | Temp 97.8°F | Ht 63.0 in | Wt 171.0 lb

## 2011-01-29 DIAGNOSIS — E559 Vitamin D deficiency, unspecified: Secondary | ICD-10-CM

## 2011-01-29 DIAGNOSIS — Z23 Encounter for immunization: Secondary | ICD-10-CM

## 2011-01-29 DIAGNOSIS — I10 Essential (primary) hypertension: Secondary | ICD-10-CM

## 2011-01-29 DIAGNOSIS — E785 Hyperlipidemia, unspecified: Secondary | ICD-10-CM

## 2011-01-29 DIAGNOSIS — Z86010 Personal history of colonic polyps: Secondary | ICD-10-CM

## 2011-01-29 DIAGNOSIS — R635 Abnormal weight gain: Secondary | ICD-10-CM

## 2011-01-29 DIAGNOSIS — R9431 Abnormal electrocardiogram [ECG] [EKG]: Secondary | ICD-10-CM

## 2011-01-29 DIAGNOSIS — M4120 Other idiopathic scoliosis, site unspecified: Secondary | ICD-10-CM

## 2011-01-29 LAB — BASIC METABOLIC PANEL
CO2: 24 mEq/L (ref 19–32)
Calcium: 9.4 mg/dL (ref 8.4–10.5)
Creatinine, Ser: 1.1 mg/dL (ref 0.4–1.5)
GFR: 70.36 mL/min (ref >60.00)
Sodium: 141 mEq/L (ref 135–145)

## 2011-01-29 LAB — HEPATIC FUNCTION PANEL
AST: 18 U/L (ref 0–37)
Albumin: 4.4 g/dL (ref 3.5–5.2)
Alkaline Phosphatase: 67 U/L (ref 39–117)
Bilirubin, Direct: 0.1 mg/dL (ref 0.0–0.3)
Total Bilirubin: 0.5 mg/dL (ref 0.3–1.2)

## 2011-01-29 LAB — LIPID PANEL
HDL: 60.5 mg/dL (ref >39.00)
Total CHOL/HDL Ratio: 3

## 2011-01-29 LAB — URINALYSIS
Leukocytes, UA: NEGATIVE
Nitrite: NEGATIVE
Specific Gravity, Urine: 1.02 (ref 1.000–1.030)
Urobilinogen, UA: 0.2 (ref 0.0–1.0)

## 2011-01-29 LAB — TSH: TSH: 3.51 u[IU]/mL (ref 0.35–5.50)

## 2011-01-29 NOTE — Progress Notes (Signed)
  Subjective:    Patient ID: Bruce Dixon, male    DOB: April 07, 1932, 77 y.o.   MRN: [id number]  HPI History of Present Illness:  23 yowm never smoker with hypertension and  hyperlipidemia, associated with moderate centripetal obesity, with the  target weight of 161 pounds.     01/29/2011 f/u ov/Alexiss Iturralde cc CPX good ex tolerance, no ex cp or sob,  tia or claudication symptoms.  Sleeping ok without nocturnal  or early am exacerbation  of respiratory  C/o's .         Past Medical History:  SCOLIOSIS (ICD-737.30)  COLONIC POLYPS, BENIGN, HX OF (ICD-V12.72)..............................Marland KitchenArlyce Dice  - Colonsocpopy 06/16/03  - Repeat colonsocopy 07/07/09 1) Two polyps in the descending colon  2) 3 mm sessile polyp in the sigmoid colon  3) Diverticula, scattered in the mid transverse colon  4 ) Path no sign dysplaisa  HYPERTENSION (ICD-401.9)  HYPERLIPIDEMIA (ICD-272.4)  - target < 130 HBP/male/no fm hx  HEALTH MAINTENANCE........................................................................Marland KitchenWert  -Td 02/2005  - Pneumovax 12/1998 (age 54)  - CPX 01/29/2011   Family History:  only child  mother died at 62  father parkinsons dz  no premature heart dz   Social History:  Retired from newspaper  Occ ETO[employer]  Never smoker  Lives in retirement center [place], eats buffet food , plays lots of tennis      Review of Systems  Constitutional: Negative for fever, chills, diaphoresis, activity change, appetite change, fatigue and unexpected weight change.  HENT: Negative for hearing loss, ear pain, nosebleeds, congestion, sore throat, facial swelling, rhinorrhea, sneezing, mouth sores, trouble swallowing, neck pain, neck stiffness, dental problem, voice change, postnasal drip, sinus pressure, tinnitus and ear discharge.   Eyes: Negative for photophobia, discharge, itching and visual disturbance.  Respiratory: Negative for apnea, cough, choking, chest tightness, shortness of breath, wheezing and  stridor.   Cardiovascular: Negative for chest pain, palpitations and leg swelling.  Gastrointestinal: Negative for nausea, vomiting, abdominal pain, constipation, blood in stool and abdominal distention.  Genitourinary: Negative for dysuria, urgency, frequency, hematuria, flank pain, decreased urine volume and difficulty urinating.  Musculoskeletal: Negative for myalgias, back pain, joint swelling, arthralgias and gait problem.  Skin: Negative for color change, pallor and rash.  Neurological: Negative for dizziness, tremors, seizures, syncope, speech difficulty, weakness, light-headedness, numbness and headaches.  Hematological: Negative for adenopathy. Does not bruise/bleed easily.  Psychiatric/Behavioral: Negative for confusion, sleep disturbance and agitation. The patient is not nervous/anxious.        Objective:   Physical Exam   173 January 21, 2008 > 172 January 20, 2009 > > 171 January 23, 2010 > 171 01/29/2011   [name] in no acute distress.    HEENT: nl dentition, turbinates, and orophanx. Nl external ear canals without cough reflex  Neck without JVD/Nodes/TM/carotids brisk without bruit  Lungs clear to A and P bilaterally without cough on insp or exp maneuvers/ mod scoliosis  RRR no s3 or murmur or increase in P2/ no displacement pmi  Abd soft and benign with nl excursion in the supine position. No bruits or organomegaly  Ext warm without calf tenderness, cyanosis clubbing or edema  GU testes down bilaterally. No IH  Rectal mild bph, stool gneg  MS Scoliosis present, minimal effect on gait, no joint restrictions      CXR  01/29/2011 :  No active cardiopulmonary disease. No interval change.    Assessment & Plan:

## 2011-01-29 NOTE — Patient Instructions (Signed)
Please remember to go to the lab and x-ray department downstairs for your tests - we will call you with the results when then are available.  Return in one year for CPX - call in meantime if any problems arise

## 2011-01-29 NOTE — Assessment & Plan Note (Signed)
-   Recheck vit D

## 2011-01-29 NOTE — Assessment & Plan Note (Signed)
Adequate control on present rx, reviewed  

## 2011-01-29 NOTE — Assessment & Plan Note (Signed)
Discussed calorie balance issues

## 2011-01-30 ENCOUNTER — Other Ambulatory Visit: Payer: Self-pay | Admitting: Internal Medicine

## 2011-01-30 LAB — VITAMIN D 25 HYDROXY (VIT D DEFICIENCY, FRACTURES): Vit D, 25-Hydroxy: 24 ng/mL — ABNORMAL LOW (ref 30–89)

## 2011-01-30 MED ORDER — ERGOCALCIFEROL 1.25 MG (50000 UT) PO CAPS: 50000.0000 [IU] | ORAL_CAPSULE | ORAL | Status: AC

## 2011-06-24 DIAGNOSIS — Q828 Other specified congenital malformations of skin: Secondary | ICD-10-CM | POA: Diagnosis not present

## 2011-06-24 DIAGNOSIS — D1801 Hemangioma of skin and subcutaneous tissue: Secondary | ICD-10-CM | POA: Diagnosis not present

## 2011-06-24 DIAGNOSIS — L578 Other skin changes due to chronic exposure to nonionizing radiation: Secondary | ICD-10-CM | POA: Diagnosis not present

## 2011-06-25 DIAGNOSIS — M79609 Pain in unspecified limb: Secondary | ICD-10-CM | POA: Diagnosis not present

## 2011-06-29 DIAGNOSIS — M79609 Pain in unspecified limb: Secondary | ICD-10-CM | POA: Diagnosis not present

## 2011-06-29 DIAGNOSIS — M76829 Posterior tibial tendinitis, unspecified leg: Secondary | ICD-10-CM | POA: Diagnosis not present

## 2011-07-04 DIAGNOSIS — M79609 Pain in unspecified limb: Secondary | ICD-10-CM | POA: Diagnosis not present

## 2011-07-10 DIAGNOSIS — M79609 Pain in unspecified limb: Secondary | ICD-10-CM | POA: Diagnosis not present

## 2011-07-15 DIAGNOSIS — M79609 Pain in unspecified limb: Secondary | ICD-10-CM | POA: Diagnosis not present

## 2011-07-17 DIAGNOSIS — M79609 Pain in unspecified limb: Secondary | ICD-10-CM | POA: Diagnosis not present

## 2011-07-19 DIAGNOSIS — M79609 Pain in unspecified limb: Secondary | ICD-10-CM | POA: Diagnosis not present

## 2011-07-22 DIAGNOSIS — M79609 Pain in unspecified limb: Secondary | ICD-10-CM | POA: Diagnosis not present

## 2011-07-24 DIAGNOSIS — M79609 Pain in unspecified limb: Secondary | ICD-10-CM | POA: Diagnosis not present

## 2011-07-26 DIAGNOSIS — M79609 Pain in unspecified limb: Secondary | ICD-10-CM | POA: Diagnosis not present

## 2011-07-29 DIAGNOSIS — M79609 Pain in unspecified limb: Secondary | ICD-10-CM | POA: Diagnosis not present

## 2011-07-31 DIAGNOSIS — M79609 Pain in unspecified limb: Secondary | ICD-10-CM | POA: Diagnosis not present

## 2011-08-15 DIAGNOSIS — M79609 Pain in unspecified limb: Secondary | ICD-10-CM | POA: Diagnosis not present

## 2011-09-09 ENCOUNTER — Other Ambulatory Visit: Payer: Self-pay | Admitting: Internal Medicine

## 2011-10-29 DIAGNOSIS — S838X9A Sprain of other specified parts of unspecified knee, initial encounter: Secondary | ICD-10-CM | POA: Diagnosis not present

## 2011-10-29 DIAGNOSIS — S86819A Strain of other muscle(s) and tendon(s) at lower leg level, unspecified leg, initial encounter: Secondary | ICD-10-CM | POA: Diagnosis not present

## 2011-12-02 ENCOUNTER — Encounter: Payer: Self-pay | Admitting: Internal Medicine

## 2011-12-23 ENCOUNTER — Other Ambulatory Visit: Payer: Self-pay | Admitting: Internal Medicine

## 2011-12-30 DIAGNOSIS — L819 Disorder of pigmentation, unspecified: Secondary | ICD-10-CM | POA: Diagnosis not present

## 2011-12-30 DIAGNOSIS — L578 Other skin changes due to chronic exposure to nonionizing radiation: Secondary | ICD-10-CM | POA: Diagnosis not present

## 2011-12-30 DIAGNOSIS — D1801 Hemangioma of skin and subcutaneous tissue: Secondary | ICD-10-CM | POA: Diagnosis not present

## 2011-12-30 DIAGNOSIS — Z85828 Personal history of other malignant neoplasm of skin: Secondary | ICD-10-CM | POA: Diagnosis not present

## 2012-01-28 ENCOUNTER — Other Ambulatory Visit (INDEPENDENT_AMBULATORY_CARE_PROVIDER_SITE_OTHER): Payer: Medicare Other

## 2012-01-28 ENCOUNTER — Encounter: Payer: Self-pay | Admitting: Internal Medicine

## 2012-01-28 ENCOUNTER — Ambulatory Visit (INDEPENDENT_AMBULATORY_CARE_PROVIDER_SITE_OTHER): Payer: Medicare Other | Admitting: Internal Medicine

## 2012-01-28 VITALS — BP 140/84 | HR 90 | Temp 98.3°F | Ht 63.75 in | Wt 176.0 lb

## 2012-01-28 DIAGNOSIS — R635 Abnormal weight gain: Secondary | ICD-10-CM

## 2012-01-28 DIAGNOSIS — Z86010 Personal history of colonic polyps: Secondary | ICD-10-CM

## 2012-01-28 DIAGNOSIS — E559 Vitamin D deficiency, unspecified: Secondary | ICD-10-CM

## 2012-01-28 DIAGNOSIS — E785 Hyperlipidemia, unspecified: Secondary | ICD-10-CM

## 2012-01-28 DIAGNOSIS — I10 Essential (primary) hypertension: Secondary | ICD-10-CM | POA: Diagnosis not present

## 2012-01-28 DIAGNOSIS — Z23 Encounter for immunization: Secondary | ICD-10-CM

## 2012-01-28 LAB — URINALYSIS
Bilirubin Urine: NEGATIVE
Ketones, ur: NEGATIVE
Leukocytes, UA: NEGATIVE
Urine Glucose: NEGATIVE
pH: 6 (ref 5.0–8.0)

## 2012-01-28 LAB — CBC WITH DIFFERENTIAL/PLATELET
Basophils Absolute: 0.1 10*3/uL (ref 0.0–0.1)
Eosinophils Relative: 1.7 % (ref 0.0–5.0)
Lymphocytes Relative: 20.7 % (ref 12.0–46.0)
Monocytes Relative: 8.6 % (ref 3.0–12.0)
Neutrophils Relative %: 67.4 % (ref 43.0–77.0)
Platelets: 245 10*3/uL (ref 150.0–400.0)
RDW: 13.2 % (ref 11.5–14.6)
WBC: 6.7 10*3/uL (ref 4.5–10.5)

## 2012-01-28 LAB — BASIC METABOLIC PANEL
BUN: 20 mg/dL (ref 6–23)
CO2: 24 mEq/L (ref 19–32)
Chloride: 107 mEq/L (ref 96–112)
Creatinine, Ser: 1.2 mg/dL (ref 0.4–1.5)
Glucose, Bld: 117 mg/dL — ABNORMAL HIGH (ref 70–99)

## 2012-01-28 LAB — HEPATIC FUNCTION PANEL
ALT: 29 U/L (ref 0–53)
Albumin: 4.1 g/dL (ref 3.5–5.2)
Total Bilirubin: 0.5 mg/dL (ref 0.3–1.2)
Total Protein: 7.2 g/dL (ref 6.0–8.3)

## 2012-01-28 NOTE — Assessment & Plan Note (Signed)
Adequate control on present rx, reviewed  

## 2012-01-28 NOTE — Patient Instructions (Signed)
Centrum A to Z one daily is a reasonable maintenance dose of vit D but if you are deficient we will call you in a mega dose to get you back to normal levels  Please remember to go to the lab and x-ray department downstairs for your tests - we will call you with the results when they are available.     Return for yearly comprehensive evaluations, sooner if needed

## 2012-01-28 NOTE — Progress Notes (Signed)
  Subjective:    Patient ID: Bruce Dixon, male    DOB: 1931-09-09, 78 y.o.   MRN: [id number]  HPI History of Present Illness:  18 yowm never smoker with hypertension and  hyperlipidemia, associated with moderate centripetal obesity, with the  target weight of 161 pounds.     01/29/2011 f/u ov/[name] cc CPX good ex tolerance Vit d low, o/w labs ok rec Vit d replacement    01/28/2012 f/u ov/[name] multiple chronic issue 1) HBP 2) R Foot tendon rupture ([place]) 3) Vit d def, no real sun exp, no longer playing outdoor tennis 4) Hyperlipidemia 5) Scoliosis    Sleeping ok without nocturnal  or early am exacerbation  of respiratory  C/o's .    ROS  The following are not active complaints unless bolded sore throat, dysphagia, dental problems, itching, sneezing,  nasal congestion or excess/ purulent secretions, ear ache,   fever, chills, sweats, unintended wt loss, pleuritic or exertional cp, hemoptysis,  orthopnea pnd or leg swelling, presyncope, palpitations, heartburn, abdominal pain, anorexia, nausea, vomiting, diarrhea  or change in bowel or urinary habits, change in stools or urine, dysuria,hematuria,  rash, arthralgias, visual complaints, headache, numbness weakness or ataxia or problems with walking or coordination,  change in mood/affect or memory.         Past Medical History:  SCOLIOSIS (ICD-737.30)  COLONIC POLYPS, BENIGN, HX OF (ICD-V12.72)..............................Marland KitchenArlyce Dice  - Colonsocpopy 06/16/03  - Repeat colonsocopy 07/07/09 1) Two polyps in the descending colon  2) 3 mm sessile polyp in the sigmoid colon  3) Diverticula, scattered in the mid transverse colon  4 ) Path no sign dysplaisa  HYPERTENSION (ICD-401.9)  HYPERLIPIDEMIA (ICD-272.4)  - target < 130 HBP/male/no fm hx  HEALTH MAINTENANCE........................................................................Marland KitchenWert  -Td 02/2005  - Pneumovax 12/1998 (age 83)  - CPX 01/28/2012   Family History:  only child    mother died at 50  father parkinsons dz  no premature heart dz   Social History:  Retired from newspaper  Occ ETOH  Never smoker  Lives in retirement center [place], eats buffet food , usually plays lots of tennis           Objective:   Physical Exam   173 January 21, 2008 > 172 January 20, 2009  > 171 01/29/2011 > 01/28/2012  176  [name] in no acute distress.    HEENT: nl dentition, turbinates, and orophanx. Nl external ear canals without cough reflex  Neck without JVD/Nodes/TM/carotids brisk without bruit  Lungs clear to A and P bilaterally without cough on insp or exp maneuvers/ mod scoliosis  RRR no s3 or murmur or increase in P2/ no displacement pmi  Abd soft and benign with nl excursion in the supine position. No bruits or organomegaly  Ext warm without calf tenderness, cyanosis clubbing or edema  GU testes down bilaterally. No IH  Rectal mild bph, stool g neg  MS Scoliosis present, minimal effect on gait, no joint restrictions Neuro alert, sensorium intact, no motor or cerebellar deficits Skin warm and dry, no lesions    CXR  01/28/2012 :  No active cardiopulmonary disease. No interval change.     Assessment & Plan:

## 2012-01-28 NOTE — Assessment & Plan Note (Addendum)
Repeat vit D level borderline but adequate at 31  rec Maintain a minimum of 400 units daily as maintenance

## 2012-01-28 NOTE — Assessment & Plan Note (Addendum)
-   target < 130 HBP/male/no fm hx    Lab Results  Component Value Date   LDLCALC 72 01/29/2011     Adequate control on present rx, reviewed

## 2012-01-29 LAB — VITAMIN D 25 HYDROXY (VIT D DEFICIENCY, FRACTURES): Vit D, 25-Hydroxy: 29 ng/mL — ABNORMAL LOW (ref 30–89)

## 2012-01-29 NOTE — Assessment & Plan Note (Signed)
-   Target wt < 169 to get BMI < 30  Discussed cal bal issues

## 2012-01-29 NOTE — Progress Notes (Signed)
Quick Note:  Spoke with pt and notified of results per Dr. [name]. Pt verbalized understanding and denied any questions.  ______ 

## 2012-01-30 LAB — TSH: TSH: 3.74 u[IU]/mL (ref 0.35–5.50)

## 2012-01-31 NOTE — Progress Notes (Signed)
Quick Note:  Spoke with pt and notified of results per Dr. [name]. Pt verbalized understanding and denied any questions.  ______ 

## 2012-04-15 DIAGNOSIS — H04229 Epiphora due to insufficient drainage, unspecified lacrimal gland: Secondary | ICD-10-CM | POA: Diagnosis not present

## 2012-04-15 DIAGNOSIS — Z961 Presence of intraocular lens: Secondary | ICD-10-CM | POA: Diagnosis not present

## 2012-04-15 DIAGNOSIS — H52209 Unspecified astigmatism, unspecified eye: Secondary | ICD-10-CM | POA: Diagnosis not present

## 2012-06-04 ENCOUNTER — Other Ambulatory Visit: Payer: Self-pay | Admitting: Internal Medicine

## 2012-06-29 ENCOUNTER — Other Ambulatory Visit: Payer: Self-pay | Admitting: Internal Medicine

## 2012-06-29 DIAGNOSIS — L578 Other skin changes due to chronic exposure to nonionizing radiation: Secondary | ICD-10-CM | POA: Diagnosis not present

## 2012-06-29 DIAGNOSIS — L819 Disorder of pigmentation, unspecified: Secondary | ICD-10-CM | POA: Diagnosis not present

## 2012-06-29 DIAGNOSIS — D1801 Hemangioma of skin and subcutaneous tissue: Secondary | ICD-10-CM | POA: Diagnosis not present

## 2012-06-29 DIAGNOSIS — L821 Other seborrheic keratosis: Secondary | ICD-10-CM | POA: Diagnosis not present

## 2012-06-29 DIAGNOSIS — L570 Actinic keratosis: Secondary | ICD-10-CM | POA: Diagnosis not present

## 2012-06-29 DIAGNOSIS — Z85828 Personal history of other malignant neoplasm of skin: Secondary | ICD-10-CM | POA: Diagnosis not present

## 2012-10-08 ENCOUNTER — Telehealth: Payer: Self-pay | Admitting: Internal Medicine

## 2012-10-08 NOTE — Telephone Encounter (Signed)
ATC pt na phone rang several times w/o response No VM wcb

## 2012-10-12 NOTE — Telephone Encounter (Signed)
LMTCB

## 2012-10-12 NOTE — Telephone Encounter (Signed)
Returning call can be reached at [phone].Bruce Dixon

## 2012-10-12 NOTE — Telephone Encounter (Signed)
LMOMTCB - When was original DNR filled out  And does pt still have original copy.

## 2012-10-13 NOTE — Telephone Encounter (Signed)
Spoke with patient is currently staying at [facility]- He says they are needing three copies of a DNR on file for patient Patient has not had a DNR order before Dr. Sherene Sires please advise if this can be done,thank you   Patient wants copies mailed to address in Epic (address has been verified)

## 2012-10-13 NOTE — Telephone Encounter (Signed)
3 DMR forms filled out and mailed

## 2012-10-13 NOTE — Telephone Encounter (Signed)
Fine with me

## 2012-10-13 NOTE — Telephone Encounter (Signed)
I have given forms to Verlon Au to have MW sign then we can mail to patient as requested.

## 2012-10-13 NOTE — Telephone Encounter (Signed)
Pt aware.

## 2012-10-13 NOTE — Telephone Encounter (Signed)
ATC patient no answer LMOMTCB 

## 2012-12-28 ENCOUNTER — Other Ambulatory Visit: Payer: Self-pay | Admitting: Dermatology

## 2012-12-28 DIAGNOSIS — D0470 Carcinoma in situ of skin of unspecified lower limb, including hip: Secondary | ICD-10-CM | POA: Diagnosis not present

## 2012-12-28 DIAGNOSIS — Z85828 Personal history of other malignant neoplasm of skin: Secondary | ICD-10-CM | POA: Diagnosis not present

## 2012-12-28 DIAGNOSIS — L821 Other seborrheic keratosis: Secondary | ICD-10-CM | POA: Diagnosis not present

## 2012-12-28 DIAGNOSIS — D1801 Hemangioma of skin and subcutaneous tissue: Secondary | ICD-10-CM | POA: Diagnosis not present

## 2012-12-30 ENCOUNTER — Other Ambulatory Visit: Payer: Self-pay | Admitting: Internal Medicine

## 2013-01-05 DIAGNOSIS — D0470 Carcinoma in situ of skin of unspecified lower limb, including hip: Secondary | ICD-10-CM | POA: Diagnosis not present

## 2013-01-05 DIAGNOSIS — Z85828 Personal history of other malignant neoplasm of skin: Secondary | ICD-10-CM | POA: Diagnosis not present

## 2013-01-27 ENCOUNTER — Ambulatory Visit (INDEPENDENT_AMBULATORY_CARE_PROVIDER_SITE_OTHER)
Admission: RE | Admit: 2013-01-27 | Discharge: 2013-01-27 | Disposition: A | Discharge status: Home / Self Care | Payer: Medicare Other | Source: Ambulatory Visit | Attending: Internal Medicine | Admitting: Internal Medicine

## 2013-01-27 ENCOUNTER — Other Ambulatory Visit (INDEPENDENT_AMBULATORY_CARE_PROVIDER_SITE_OTHER): Payer: Medicare Other

## 2013-01-27 ENCOUNTER — Ambulatory Visit (INDEPENDENT_AMBULATORY_CARE_PROVIDER_SITE_OTHER): Payer: Medicare Other | Admitting: Internal Medicine

## 2013-01-27 ENCOUNTER — Encounter: Payer: Self-pay | Admitting: Internal Medicine

## 2013-01-27 VITALS — BP 138/72 | HR 52 | Temp 97.7°F | Ht 64.5 in | Wt 171.2 lb

## 2013-01-27 DIAGNOSIS — E785 Hyperlipidemia, unspecified: Secondary | ICD-10-CM

## 2013-01-27 DIAGNOSIS — I10 Essential (primary) hypertension: Secondary | ICD-10-CM

## 2013-01-27 DIAGNOSIS — Z23 Encounter for immunization: Secondary | ICD-10-CM

## 2013-01-27 DIAGNOSIS — Z860100 Personal history of colon polyps, unspecified: Secondary | ICD-10-CM

## 2013-01-27 DIAGNOSIS — E559 Vitamin D deficiency, unspecified: Secondary | ICD-10-CM | POA: Diagnosis not present

## 2013-01-27 DIAGNOSIS — M4120 Other idiopathic scoliosis, site unspecified: Secondary | ICD-10-CM

## 2013-01-27 DIAGNOSIS — Z0000 Encounter for general adult medical examination without abnormal findings: Secondary | ICD-10-CM | POA: Diagnosis not present

## 2013-01-27 LAB — CBC WITH DIFFERENTIAL/PLATELET
Basophils Relative: 1.4 % (ref 0.0–3.0)
Eosinophils Relative: 2.1 % (ref 0.0–5.0)
HCT: 41.3 % (ref 39.0–52.0)
Hemoglobin: 14.1 g/dL (ref 13.0–17.0)
Lymphocytes Relative: 20.4 % (ref 12.0–46.0)
MCHC: 34.1 g/dL (ref 30.0–36.0)
MCV: 89.5 fl (ref 78.0–100.0)
Monocytes Absolute: 0.6 10*3/uL (ref 0.1–1.0)
Neutro Abs: 4.7 10*3/uL (ref 1.4–7.7)
Neutrophils Relative %: 67.7 % (ref 43.0–77.0)
RBC: 4.62 Mil/uL (ref 4.22–5.81)
RDW: 13.7 % (ref 11.5–14.6)
WBC: 7 10*3/uL (ref 4.5–10.5)

## 2013-01-27 LAB — URINALYSIS
Bilirubin Urine: NEGATIVE
Hgb urine dipstick: NEGATIVE
Ketones, ur: NEGATIVE
Specific Gravity, Urine: 1.025 (ref 1.000–1.030)
Total Protein, Urine: NEGATIVE
Urine Glucose: NEGATIVE
Urobilinogen, UA: 0.2 (ref 0.0–1.0)
pH: 6 (ref 5.0–8.0)

## 2013-01-27 LAB — TSH: TSH: 4.19 u[IU]/mL (ref 0.35–5.50)

## 2013-01-27 LAB — LIPID PANEL
LDL Cholesterol: 76 mg/dL (ref 0–99)
Total CHOL/HDL Ratio: 3

## 2013-01-27 LAB — BASIC METABOLIC PANEL
CO2: 27 mEq/L (ref 19–32)
Chloride: 108 mEq/L (ref 96–112)
Creatinine, Ser: 1.1 mg/dL (ref 0.4–1.5)
Glucose, Bld: 123 mg/dL — ABNORMAL HIGH (ref 70–99)
Potassium: 4.8 mEq/L (ref 3.5–5.1)
Sodium: 142 mEq/L (ref 135–145)

## 2013-01-27 LAB — HEPATIC FUNCTION PANEL
ALT: 24 U/L (ref 0–53)
AST: 22 U/L (ref 0–37)
Albumin: 4.1 g/dL (ref 3.5–5.2)
Alkaline Phosphatase: 57 U/L (ref 39–117)
Total Protein: 7 g/dL (ref 6.0–8.3)

## 2013-01-27 NOTE — Progress Notes (Signed)
Quick Note:  Advised pt of EKG and CXR results per MW. Pt verbalized understanding and has no further questions at this time ______

## 2013-01-27 NOTE — Progress Notes (Signed)
Subjective:    Patient ID: Bruce Dixon, male    DOB: 1931/05/10, 79 y.o.   MRN: [id number]  HPI History of Present Illness:  46 yowm never smoker with hypertension and  hyperlipidemia, associated with moderate centripetal obesity, with the  target weight of 161 pounds.     01/29/2011 f/u ov/Bruce Dixon cc CPX good ex tolerance Vit d low, o/w labs ok rec Vit d replacement    01/27/2013 annual  Comprehensive f/u ov/Bruce Dixon re:     1) HBP 2) R Foot tendon rupture (Bruce Dixon) 3) Vit d def, no real sun exp, no longer playing outdoor tennis 4) Hyperlipidemia 5) Scoliosis   No obvious day to day or daytime variabilty or assoc chronic cough or cp or chest tightness, subjective wheeze overt sinus or hb symptoms. No unusual exp hx or h/o childhood pna/ asthma or knowledge of premature birth.  Sleeping ok without nocturnal  or early am exacerbation  of respiratory  c/o's or need for noct saba. Also denies any obvious fluctuation of symptoms with weather or environmental changes or other aggravating or alleviating factors except as outlined above   Current Medications, Allergies, Complete Past Medical History, Past Surgical History, Family History, and Social History were reviewed in [employer] record.  ROS  The following are not active complaints unless bolded sore throat, dysphagia, dental problems, itching, sneezing,  nasal congestion or excess/ purulent secretions, ear ache,   fever, chills, sweats, unintended wt loss, pleuritic or exertional cp, hemoptysis,  orthopnea pnd or leg swelling, presyncope, palpitations, heartburn, abdominal pain, anorexia, nausea, vomiting, diarrhea  or change in bowel or urinary habits, change in stools or urine, dysuria,hematuria,  rash, arthralgias, visual complaints, headache, numbness weakness or ataxia or problems with walking or coordination,  change in mood/affect or memory.              Past Medical History:  SCOLIOSIS (ICD-737.30)   COLONIC POLYPS, BENIGN, HX OF (ICD-V12.72)..............................Marland KitchenArlyce Dixon  - Colonsocpopy 06/16/03  - Repeat colonsocopy 07/07/09 1) Two polyps in the descending colon  2) 3 mm sessile polyp in the sigmoid colon  3) Diverticula, scattered in the mid transverse colon  4 ) Path no sign dysplaisa  HYPERTENSION (ICD-401.9)  HYPERLIPIDEMIA (ICD-272.4)  - target < 130 HBP/male/no fm hx  HEALTH MAINTENANCE........................................................................Marland KitchenWert  -Td 02/2005  - Pneumovax 12/1998 (age 76)  - CPX 01/27/2013   Family History:  only child  mother died at 32  father parkinsons dz  no premature heart dz    Social History:  Retired from newspaper  Occ ETOH  Never smoker  Lives in retirement center [place], eats buffet food , usually plays lots of tennis           Objective:   Physical Exam   173 January 21, 2008 > 172 January 20, 2009  > 171 01/29/2011 > 01/28/2012  176 > 171 01/27/2013   [name] in no acute distress.    HEENT: nl dentition, turbinates, and orophanx. Nl external ear canals without cough reflex  Neck without JVD/Nodes/TM/carotids brisk without bruit  Lungs clear to A and P bilaterally without cough on insp or exp maneuvers/ mod scoliosis  RRR no s3 or murmur or increase in P2/ no displacement pmi  Abd soft and benign with nl excursion in the supine position. No bruits or organomegaly  Ext warm without calf tenderness, cyanosis clubbing or edema  GU testes down bilaterally. No IH  Rectal mild/mod  bph, stool g neg  MS Scoliosis present, minimal effect on gait, no joint restrictions Neuro alert, sensorium intact, no motor or cerebellar deficits Skin warm and dry, no lesions    CXR  01/27/2013 :  No active cardiopulmonary disease. No significant change.     Assessment & Plan:

## 2013-01-27 NOTE — Patient Instructions (Signed)
Please remember to go to the lab and x-ray department downstairs for your tests - we will call you with the results when they are available.    Please schedule a follow up visit in 12 months but call sooner if needed for cpx on return

## 2013-01-28 ENCOUNTER — Telehealth: Payer: Self-pay | Admitting: Internal Medicine

## 2013-01-28 NOTE — Assessment & Plan Note (Signed)
Adequate control on present rx, reviewed > no change in rx needed   

## 2013-01-28 NOTE — Assessment & Plan Note (Signed)
No progression 

## 2013-01-28 NOTE — Telephone Encounter (Signed)
Notes Recorded by Nyoka Cowden, MD on 01/27/2013 at 11:48 AM Call pt: Reviewed cxr and no acute change so no change in recommendations made at ov      I spoke with patient about results and he verbalized understanding and had no questions

## 2013-01-28 NOTE — Assessment & Plan Note (Signed)
-   target < 130 HBP/male/no fm hx   Lab Results  Component Value Date   CHOL 154 01/27/2013   HDL 55.70 01/27/2013   LDLCALC 76 01/27/2013   TRIG 114.0 01/27/2013   CHOLHDL 3 01/27/2013     Adequate control on present rx, reviewed > no change in rx needed

## 2013-01-28 NOTE — Assessment & Plan Note (Signed)
Followed by [facility], Dr Arlyce Dice    Recent Labs Lab 01/27/13 0943  HGB 14.1     Results of last colonoscopy reviewed with pt

## 2013-01-28 NOTE — Assessment & Plan Note (Signed)
Last levels ok, on adequate rx, reviewed

## 2013-03-02 ENCOUNTER — Other Ambulatory Visit: Payer: Self-pay | Admitting: Internal Medicine

## 2013-04-19 DIAGNOSIS — Z961 Presence of intraocular lens: Secondary | ICD-10-CM | POA: Diagnosis not present

## 2013-04-19 DIAGNOSIS — H52209 Unspecified astigmatism, unspecified eye: Secondary | ICD-10-CM | POA: Diagnosis not present

## 2013-04-19 DIAGNOSIS — H524 Presbyopia: Secondary | ICD-10-CM | POA: Diagnosis not present

## 2013-06-18 ENCOUNTER — Encounter: Payer: Self-pay | Admitting: Gastroenterology

## 2013-07-19 ENCOUNTER — Encounter: Payer: Self-pay | Admitting: Gastroenterology

## 2013-08-17 ENCOUNTER — Other Ambulatory Visit: Payer: Self-pay | Admitting: Internal Medicine

## 2013-09-08 ENCOUNTER — Encounter: Payer: Self-pay | Admitting: Internal Medicine

## 2013-09-08 ENCOUNTER — Encounter: Payer: Self-pay | Admitting: Gastroenterology

## 2013-09-08 ENCOUNTER — Ambulatory Visit (INDEPENDENT_AMBULATORY_CARE_PROVIDER_SITE_OTHER): Payer: Medicare Other | Admitting: Gastroenterology

## 2013-09-08 VITALS — BP 128/66 | HR 92 | Ht 63.5 in | Wt 170.0 lb

## 2013-09-08 DIAGNOSIS — Z860100 Personal history of colon polyps, unspecified: Secondary | ICD-10-CM

## 2013-09-08 NOTE — Assessment & Plan Note (Signed)
Patient has history of recurrent adenomatous polyps.  I explained to him that we generally stop routine surveillance colonoscopy at 78-80.  We individualize recommendations.  I explained the risks and benefits of colonoscopy.  The patient has decided to not pursue surveillance colonoscopy at this time.

## 2013-09-08 NOTE — Progress Notes (Signed)
    _                                                                                                                History of Present Illness: 80 year old white male with history of colon polyps, hypertension scoliosis here for consideration of followup colonoscopy.  Last examined 2010 demonstrated adenomatous polyps.  Polyps were removed in 2000 and 2005.  The patient has no GI complaints including abdominal pain, change of bowel habits, or rectal bleeding.  Altogether he is feeling well.    Past Medical History  Diagnosis Date  . Scoliosis   . Colonic polyp   . Hypertension   . Hyperlipidemia    Past Surgical History  Procedure Laterality Date  . Cataract extraction Bilateral   . Inguinal hernia repair Bilateral 1970  . Hiatal hernia repair      as a child  . Appendectomy     family history includes Parkinsonism in his father. Current Outpatient Prescriptions  Medication Sig Dispense Refill  . amLODipine-valsartan (EXFORGE) 5-160 MG per tablet Take 1 tablet by mouth daily.  30 tablet  11  . amLODipine-valsartan (EXFORGE) 5-160 MG per tablet TAKE ONE (1) TABLET EACH DAY  30 tablet  11  . aspirin 81 MG tablet 1/2 tablet daily      . atorvastatin (LIPITOR) 10 MG tablet TAKE ONE (1) TABLET EACH DAY  30 tablet  6  . Multiple Vitamins-Minerals (CENTRUM SILVER) tablet Take 1 tablet by mouth daily.      [name] VITAMIN D, CHOLECALCIFEROL, PO Take 1 tablet by mouth every 7 (seven) days.       No current facility-administered medications for this visit.   Allergies as of 09/08/2013  . (No Known Allergies)    reports that he has never smoked. He has never used smokeless tobacco. He reports that he drinks alcohol. He reports that he does not use illicit drugs.     Review of Systems: Pertinent positive and negative review of systems were noted in the above HPI section. All other review of systems were otherwise negative.  Vital signs were reviewed in today's medical  record Physical Exam: General: Well developed , well nourished, no acute distress Skin: anicteric Head: Normocephalic and atraumatic Eyes:  sclerae anicteric, EOMI Ears: Normal auditory acuity Mouth: No deformity or lesions Neck: Supple, no masses or thyromegaly Lungs: Clear throughout to auscultation Heart: Regular rate and rhythm; no murmurs, rubs or bruits Abdomen: Soft, non tender and non distended. No masses, hepatosplenomegaly or hernias noted. Normal Bowel sounds Rectal:deferred Musculoskeletal: Symmetrical with no gross deformities  Skin: No lesions on visible extremities Pulses:  Normal pulses noted Extremities: No clubbing, cyanosis, edema or deformities noted Neurological: Alert oriented x 4, grossly nonfocal Cervical Nodes:  No significant cervical adenopathy Inguinal Nodes: No significant inguinal adenopathy Psychological:  Alert and cooperative. Normal mood and affect  See Assessment and Plan under Problem List

## 2013-09-08 NOTE — Patient Instructions (Signed)
Follow up as needed

## 2013-12-28 DIAGNOSIS — D235 Other benign neoplasm of skin of trunk: Secondary | ICD-10-CM | POA: Diagnosis not present

## 2013-12-28 DIAGNOSIS — Z85828 Personal history of other malignant neoplasm of skin: Secondary | ICD-10-CM | POA: Diagnosis not present

## 2013-12-28 DIAGNOSIS — L821 Other seborrheic keratosis: Secondary | ICD-10-CM | POA: Diagnosis not present

## 2013-12-28 DIAGNOSIS — D1801 Hemangioma of skin and subcutaneous tissue: Secondary | ICD-10-CM | POA: Diagnosis not present

## 2013-12-28 DIAGNOSIS — L570 Actinic keratosis: Secondary | ICD-10-CM | POA: Diagnosis not present

## 2014-01-18 ENCOUNTER — Encounter: Payer: Self-pay | Admitting: Gastroenterology

## 2014-01-28 ENCOUNTER — Ambulatory Visit: Payer: Medicare Other | Admitting: Internal Medicine

## 2014-01-31 ENCOUNTER — Ambulatory Visit (INDEPENDENT_AMBULATORY_CARE_PROVIDER_SITE_OTHER): Payer: Medicare Other | Admitting: Internal Medicine

## 2014-01-31 ENCOUNTER — Other Ambulatory Visit (INDEPENDENT_AMBULATORY_CARE_PROVIDER_SITE_OTHER): Payer: Medicare Other

## 2014-01-31 ENCOUNTER — Encounter: Payer: Self-pay | Admitting: Internal Medicine

## 2014-01-31 ENCOUNTER — Ambulatory Visit (INDEPENDENT_AMBULATORY_CARE_PROVIDER_SITE_OTHER)
Admission: RE | Admit: 2014-01-31 | Discharge: 2014-01-31 | Disposition: A | Discharge status: Home / Self Care | Payer: Medicare Other | Source: Ambulatory Visit | Attending: Internal Medicine | Admitting: Internal Medicine

## 2014-01-31 VITALS — BP 118/66 | HR 64 | Temp 97.7°F | Ht 64.0 in | Wt 168.0 lb

## 2014-01-31 DIAGNOSIS — I10 Essential (primary) hypertension: Secondary | ICD-10-CM

## 2014-01-31 DIAGNOSIS — E785 Hyperlipidemia, unspecified: Secondary | ICD-10-CM | POA: Diagnosis not present

## 2014-01-31 DIAGNOSIS — Z86010 Personal history of colonic polyps: Secondary | ICD-10-CM | POA: Diagnosis not present

## 2014-01-31 DIAGNOSIS — Z0000 Encounter for general adult medical examination without abnormal findings: Secondary | ICD-10-CM | POA: Diagnosis not present

## 2014-01-31 DIAGNOSIS — Z23 Encounter for immunization: Secondary | ICD-10-CM | POA: Diagnosis not present

## 2014-01-31 LAB — CBC WITH DIFFERENTIAL/PLATELET
BASOS ABS: 0.1 10*3/uL (ref 0.0–0.1)
BASOS PCT: 1.5 % (ref 0.0–3.0)
EOS PCT: 2.5 % (ref 0.0–5.0)
Eosinophils Absolute: 0.2 10*3/uL (ref 0.0–0.7)
HCT: 42.8 % (ref 39.0–52.0)
HEMOGLOBIN: 14.4 g/dL (ref 13.0–17.0)
LYMPHS PCT: 20.7 % (ref 12.0–46.0)
Lymphs Abs: 1.4 10*3/uL (ref 0.7–4.0)
MCHC: 33.6 g/dL (ref 30.0–36.0)
MCV: 90.7 fl (ref 78.0–100.0)
Monocytes Absolute: 0.6 10*3/uL (ref 0.1–1.0)
Monocytes Relative: 8.9 % (ref 3.0–12.0)
NEUTROS ABS: 4.6 10*3/uL (ref 1.4–7.7)
Neutrophils Relative %: 66.4 % (ref 43.0–77.0)
Platelets: 224 10*3/uL (ref 150.0–400.0)
RBC: 4.72 Mil/uL (ref 4.22–5.81)
RDW: 13.6 % (ref 11.5–15.5)
WBC: 6.9 10*3/uL (ref 4.0–10.5)

## 2014-01-31 LAB — BASIC METABOLIC PANEL
BUN: 24 mg/dL — ABNORMAL HIGH (ref 6–23)
CALCIUM: 9 mg/dL (ref 8.4–10.5)
CO2: 25 meq/L (ref 19–32)
Chloride: 107 mEq/L (ref 96–112)
Creatinine, Ser: 1 mg/dL (ref 0.4–1.5)
GFR: 75.43 mL/min (ref >60.00)
Glucose, Bld: 116 mg/dL — ABNORMAL HIGH (ref 70–99)
Potassium: 4.1 mEq/L (ref 3.5–5.1)
Sodium: 140 mEq/L (ref 135–145)

## 2014-01-31 LAB — LIPID PANEL
CHOL/HDL RATIO: 3
Cholesterol: 153 mg/dL (ref 0–200)
HDL: 55 mg/dL (ref >39.00)
LDL Cholesterol: 69 mg/dL (ref 0–99)
NONHDL: 98
Triglycerides: 147 mg/dL (ref 0.0–149.0)
VLDL: 29.4 mg/dL (ref 0.0–40.0)

## 2014-01-31 LAB — URINALYSIS
BILIRUBIN URINE: NEGATIVE
HGB URINE DIPSTICK: NEGATIVE
KETONES UR: NEGATIVE
Leukocytes, UA: NEGATIVE
Nitrite: NEGATIVE
SPECIFIC GRAVITY, URINE: 1.02 (ref 1.000–1.030)
Total Protein, Urine: NEGATIVE
Urine Glucose: NEGATIVE
Urobilinogen, UA: 0.2 (ref 0.0–1.0)
pH: 6.5 (ref 5.0–8.0)

## 2014-01-31 LAB — HEPATIC FUNCTION PANEL
ALBUMIN: 3.7 g/dL (ref 3.5–5.2)
ALT: 24 U/L (ref 0–53)
AST: 25 U/L (ref 0–37)
Alkaline Phosphatase: 57 U/L (ref 39–117)
Bilirubin, Direct: 0.1 mg/dL (ref 0.0–0.3)
TOTAL PROTEIN: 7.2 g/dL (ref 6.0–8.3)
Total Bilirubin: 0.7 mg/dL (ref 0.2–1.2)

## 2014-01-31 LAB — TSH: TSH: 3.79 u[IU]/mL (ref 0.35–4.50)

## 2014-01-31 NOTE — Assessment & Plan Note (Signed)
Adequate control on present rx, reviewed > no change in rx needed   

## 2014-01-31 NOTE — Assessment & Plan Note (Addendum)
-   target < 130 HBP/male/no fm hx   Lab Results  Component Value Date   CHOL 153 01/31/2014   HDL 55.00 01/31/2014   LDLCALC 69 01/31/2014   TRIG 147.0 01/31/2014   CHOLHDL 3 01/31/2014     Adequate control on present rx, reviewed > no change in rx needed

## 2014-01-31 NOTE — Patient Instructions (Signed)
Flu shot and prevnar today   Please remember to go to the lab and x-ray department downstairs for your tests - we will call you with the results when they are available.    Follow up in one year - call sooner if needed

## 2014-01-31 NOTE — Progress Notes (Signed)
Subjective:    Patient ID: Bruce Dixon, male    DOB: 11/23/1931 .   MRN: [id number]    Brief patient profile:  65 yowm never smoker with hypertension and hyperlipidemia, associated with moderate centripetal obesity, with the target weight of 161 pounds.     01/29/2011 f/u ov/Aureliano Oshields cc CPX good ex tolerance Vit d low, o/w labs ok rec Vit d replacement    01/31/2014  annual  Comprehensive f/u ov/Jinger Middlesworth re:     1) HBP 2) R Foot tendon rupture ([place]) 3) Vit d def, no real sun exp, no longer playing outdoor tennis 4) Hyperlipidemia- on lipitor 10 mg one half daily  5) Scoliosis   No obvious day to day or daytime variabilty or assoc chronic cough or cp or chest tightness, subjective wheeze overt sinus or hb symptoms. No unusual exp hx or h/o childhood pna/ asthma or knowledge of premature birth.  Sleeping ok without nocturnal  or early am exacerbation  of respiratory  c/o's or need for noct saba. Also denies any obvious fluctuation of symptoms with weather or environmental changes or other aggravating or alleviating factors except as outlined above   Current Medications, Allergies, Complete Past Medical History, Past Surgical History, Family History, and Social History were reviewed in [employer] record.  ROS  The following are not active complaints unless bolded sore throat, dysphagia, dental problems, itching, sneezing,  nasal congestion or excess/ purulent secretions, ear ache,   fever, chills, sweats, unintended wt loss, pleuritic or exertional cp, hemoptysis,  orthopnea pnd or leg swelling, presyncope, palpitations, heartburn, abdominal pain, anorexia, nausea, vomiting, diarrhea  or change in bowel or urinary habits, change in stools or urine, dysuria,hematuria,  rash, arthralgias, visual complaints, headache, numbness weakness or ataxia or problems with walking or coordination,  change in mood/affect or memory.              Past Medical History:   SCOLIOSIS (ICD-737.30)  COLONIC POLYPS, BENIGN, HX OF (ICD-V12.72)..............................Marland KitchenDeatra Ina  - Colonsocpopy 06/16/03  - Repeat colonsocopy 07/07/09 1) Two polyps in the descending colon  2) 3 mm sessile polyp in the sigmoid colon  3) Diverticula, scattered in the mid transverse colon  4 ) Path no sign dysplaisa  HYPERTENSION (ICD-401.9)  HYPERLIPIDEMIA (ICD-272.4)  - target < 130 HBP/male/no fm hx  HEALTH MAINTENANCE........................................................................Marland KitchenWert  -Td 02/2005  - Pneumovax 12/1998 (age 46) , prevnar 01/31/2014  - CPX  01/31/2014   Family History:  only child  mother died at 65  father parkinsons dz  no premature heart dz    Social History:  Retired from newspaper  Occ ETOH  Never smoker  Lives in retirement center [place], eats buffet food , usually plays lots of tennis           Objective:   Physical Exam   173 January 21, 2008 > 172 January 20, 2009  > 171 01/29/2011 > 01/28/2012  176 > 171 01/27/2013 > 01/31/2014  168  [name] in no acute distress.    HEENT: nl dentition, turbinates, and orophanx. Ear canals pos wax on Right/   without cough reflex  Neck without JVD/Nodes/TM/carotids brisk without bruit  Lungs clear to A and P bilaterally without cough on insp or exp maneuvers/ mod scoliosis  RRR no s3 or murmur or increase in P2/ no displacement pmi  Abd soft obese but   benign with nl excursion in the supine position. No bruits or organomegaly  Ext warm without calf  tenderness, cyanosis clubbing or edema  GU testes down bilaterally. No IH  Rectal mild   bph, stool g neg  MS Scoliosis present, minimal effect on gait, no joint restrictions Neuro alert, sensorium intact, no motor or cerebellar deficits Skin warm and dry, no lesions    CXR  01/31/2014 :  There is no edema or consolidation. Heart size and pulmonary  vascularity are normal. No adenopathy. Evidence of old rib  trauma on  the left as well as upper thoracic scoliosis remains     Recent Labs Lab 01/31/14 0935  NA 140  K 4.1  CL 107  CO2 25  BUN 24*  CREATININE 1.0  GLUCOSE 116*    Recent Labs Lab 01/31/14 0935  HGB 14.4  HCT 42.8  WBC 6.9  PLT 224.0     Lab Results  Component Value Date   TSH 3.79 01/31/2014            Assessment & Plan:

## 2014-02-01 NOTE — Assessment & Plan Note (Signed)
Followed by [facility], Dr [name]  - declined colonoscopy repeat 09/08/2013  (see GI ov)  Stool g neg Lab Results  Component Value Date   HGB 14.4 01/31/2014   HGB 14.1 01/27/2013   HGB 15.3 01/28/2012     Does not desire further gi eval/ understands risk/ benefit

## 2014-03-28 ENCOUNTER — Other Ambulatory Visit: Payer: Self-pay | Admitting: Internal Medicine

## 2014-04-26 DIAGNOSIS — H52203 Unspecified astigmatism, bilateral: Secondary | ICD-10-CM | POA: Diagnosis not present

## 2014-04-26 DIAGNOSIS — Z961 Presence of intraocular lens: Secondary | ICD-10-CM | POA: Diagnosis not present

## 2014-04-26 DIAGNOSIS — H40011 Open angle with borderline findings, low risk, right eye: Secondary | ICD-10-CM | POA: Diagnosis not present

## 2014-08-18 ENCOUNTER — Other Ambulatory Visit: Payer: Self-pay | Admitting: Internal Medicine

## 2014-10-24 ENCOUNTER — Other Ambulatory Visit: Payer: Self-pay | Admitting: Internal Medicine

## 2015-01-02 DIAGNOSIS — D225 Melanocytic nevi of trunk: Secondary | ICD-10-CM | POA: Diagnosis not present

## 2015-01-02 DIAGNOSIS — L821 Other seborrheic keratosis: Secondary | ICD-10-CM | POA: Diagnosis not present

## 2015-01-02 DIAGNOSIS — L570 Actinic keratosis: Secondary | ICD-10-CM | POA: Diagnosis not present

## 2015-01-02 DIAGNOSIS — Z85828 Personal history of other malignant neoplasm of skin: Secondary | ICD-10-CM | POA: Diagnosis not present

## 2015-01-02 DIAGNOSIS — D1801 Hemangioma of skin and subcutaneous tissue: Secondary | ICD-10-CM | POA: Diagnosis not present

## 2015-02-07 ENCOUNTER — Other Ambulatory Visit (INDEPENDENT_AMBULATORY_CARE_PROVIDER_SITE_OTHER): Payer: Medicare Other

## 2015-02-07 ENCOUNTER — Encounter: Payer: Self-pay | Admitting: Internal Medicine

## 2015-02-07 ENCOUNTER — Ambulatory Visit (INDEPENDENT_AMBULATORY_CARE_PROVIDER_SITE_OTHER): Payer: Medicare Other | Admitting: Internal Medicine

## 2015-02-07 ENCOUNTER — Ambulatory Visit (INDEPENDENT_AMBULATORY_CARE_PROVIDER_SITE_OTHER)
Admission: RE | Admit: 2015-02-07 | Discharge: 2015-02-07 | Disposition: A | Discharge status: Home / Self Care | Payer: Medicare Other | Source: Ambulatory Visit | Attending: Internal Medicine | Admitting: Internal Medicine

## 2015-02-07 ENCOUNTER — Telehealth: Payer: Self-pay | Admitting: Internal Medicine

## 2015-02-07 VITALS — BP 112/80 | HR 89 | Ht 63.0 in | Wt 172.0 lb

## 2015-02-07 DIAGNOSIS — I10 Essential (primary) hypertension: Secondary | ICD-10-CM

## 2015-02-07 DIAGNOSIS — E785 Hyperlipidemia, unspecified: Secondary | ICD-10-CM | POA: Diagnosis not present

## 2015-02-07 DIAGNOSIS — Z23 Encounter for immunization: Secondary | ICD-10-CM | POA: Diagnosis not present

## 2015-02-07 LAB — BASIC METABOLIC PANEL
BUN: 23 mg/dL (ref 6–23)
CHLORIDE: 105 meq/L (ref 96–112)
CO2: 27 meq/L (ref 19–32)
Calcium: 10 mg/dL (ref 8.4–10.5)
Creatinine, Ser: 1.06 mg/dL (ref 0.40–1.50)
GFR: 71.16 mL/min (ref >60.00)
Glucose, Bld: 144 mg/dL — ABNORMAL HIGH (ref 70–99)
POTASSIUM: 4.5 meq/L (ref 3.5–5.1)
Sodium: 141 mEq/L (ref 135–145)

## 2015-02-07 LAB — HEPATIC FUNCTION PANEL
ALT: 23 U/L (ref 0–53)
AST: 19 U/L (ref 0–37)
Albumin: 4.5 g/dL (ref 3.5–5.2)
Alkaline Phosphatase: 66 U/L (ref 39–117)
BILIRUBIN TOTAL: 0.5 mg/dL (ref 0.2–1.2)
Bilirubin, Direct: 0.1 mg/dL (ref 0.0–0.3)
TOTAL PROTEIN: 7.4 g/dL (ref 6.0–8.3)

## 2015-02-07 LAB — URINALYSIS
BILIRUBIN URINE: NEGATIVE
Hgb urine dipstick: NEGATIVE
Ketones, ur: NEGATIVE
LEUKOCYTES UA: NEGATIVE
NITRITE: NEGATIVE
PH: 6 (ref 5.0–8.0)
SPECIFIC GRAVITY, URINE: 1.02 (ref 1.000–1.030)
Total Protein, Urine: NEGATIVE
Urine Glucose: NEGATIVE
Urobilinogen, UA: 0.2 (ref 0.0–1.0)

## 2015-02-07 LAB — LIPID PANEL
CHOLESTEROL: 166 mg/dL (ref 0–200)
HDL: 58.9 mg/dL (ref >39.00)
LDL Cholesterol: 75 mg/dL (ref 0–99)
NonHDL: 106.76
TRIGLYCERIDES: 160 mg/dL — AB (ref 0.0–149.0)
Total CHOL/HDL Ratio: 3
VLDL: 32 mg/dL (ref 0.0–40.0)

## 2015-02-07 LAB — TSH: TSH: 3.95 u[IU]/mL (ref 0.35–4.50)

## 2015-02-07 NOTE — Patient Instructions (Signed)
Weight control is simply a matter of calorie balance which needs to be tilted in your favor by eating less and exercising more.  To get the most out of exercise, you need to be continuously aware that you are short of breath, but never out of breath, for 30 minutes daily. As you improve, it will actually be easier for you to do the same amount of exercise  in  30 minutes so always push to the level where you are short of breath.  If this does not result in gradual weight reduction then I strongly recommend you see a nutritionist with a food diary x 2 weeks so that we can work out a negative calorie balance which is universally effective in steady weight loss programs.  Think of your calorie balance like you do your bank account where in this case you want the balance to go down so you must take in less calories than you burn up.  It's just that simple:  Hard to do, but easy to understand.  Good luck!   Tetanus shot today    Follow up in one year - call sooner if needed    .

## 2015-02-07 NOTE — Progress Notes (Signed)
Quick Note:  LMTCB ______ 

## 2015-02-07 NOTE — Telephone Encounter (Signed)
Patient notified of lab results and CXR results. Nothing further needed. Closing encounter

## 2015-02-07 NOTE — Progress Notes (Signed)
Subjective:    Patient ID: Bruce Dixon, male    DOB: 12/19/1931 .   MRN: [id number]    Brief patient profile:  81 yowm never smoker with hypertension and hyperlipidemia, associated with moderate centripetal obesity, with the target weight of 161 pounds.     01/29/2011 f/u ov/[name] cc CPX good ex tolerance Vit d low, o/w labs ok rec Vit d replacement    02/07/2015  f/u ov/[name] re: yearly eval  Chief Complaint  Patient presents with  . Annual Exam    Pt is fasting. He denies any co's today.     Body mass index is 30.48 kg/(m^2).     1) HBP 2) R Foot tendon rupture Beola Cord) 3) Vit d def, no real sun exp, no longer playing outdoor tennis 4) Hyperlipidemia- on lipitor 10 mg one half daily  5) Scoliosis   No obvious day to day or daytime variabilty or assoc chronic cough or cp or chest tightness, subjective wheeze overt sinus or hb symptoms. No unusual exp hx or h/o childhood pna/ asthma or knowledge of premature birth.  Sleeping ok without nocturnal  or early am exacerbation  of respiratory  c/o's or need for noct saba. Also denies any obvious fluctuation of symptoms with weather or environmental changes or other aggravating or alleviating factors except as outlined above   Current Medications, Allergies, Complete Past Medical History, Past Surgical History, Family History, and Social History were reviewed in [employer] record.  ROS  The following are not active complaints unless bolded sore throat, dysphagia, dental problems, itching, sneezing,  nasal congestion or excess/ purulent secretions, ear ache,   fever, chills, sweats, unintended wt loss, pleuritic or exertional cp, hemoptysis,  orthopnea pnd or leg swelling, presyncope, palpitations, heartburn, abdominal pain, anorexia, nausea, vomiting, diarrhea  or change in bowel or urinary habits, change in stools or urine, dysuria,hematuria,  rash, arthralgias, visual complaints, headache, numbness weakness or  ataxia or problems with walking or coordination,  change in mood/affect or memory.              Past Medical History:  SCOLIOSIS (ICD-737.30)  COLONIC POLYPS, BENIGN, HX OF (ICD-V12.72)..............................Bruce KitchenDeatra Dixon  - Colonsocpopy 06/16/03  - Repeat colonsocopy 07/07/09 1) Two polyps in the descending colon  2) 3 mm sessile polyp in the sigmoid colon  3) Diverticula, scattered in the mid transverse colon  4 ) Path no sign dysplaisa  HYPERTENSION (ICD-401.9)  HYPERLIPIDEMIA (ICD-272.4)  - target < 130 HBP/male/no fm hx  HEALTH MAINTENANCE........................................................................Bruce KitchenWert  -Td 02/2005  And 02/07/2015  - Pneumovax 12/1998 (age 81) , prevnar 01/31/2014  - CPX  79/04/2014   Family History:  only child  mother died at 87  father parkinsons dz  no premature heart dz    Social History:  Retired from newspaper  Occ ETOH  Never smoker  Lives in retirement center [place], eats buffet food , usually plays lots of tennis           Objective:   Physical Exam   79 January 21, 2008 > 79 January 20, 2009  > 79 01/29/2011 > 79/22/2013  176 > 171 01/27/2013 > 79/26/2015  168 > 02/07/2015    172   [name] in no acute distress/ vital signs reviewed    HEENT: nl dentition, turbinates, and orophanx. Ear canals pos wax completel obst on Right/  Left is clear/ without cough reflex  Neck without JVD/Nodes/TM/carotids brisk without bruit  Lungs clear to A and  P bilaterally without cough on insp or exp maneuvers/ mod scoliosis  RRR no s3 or murmur or increase in P2/ no displacement pmi  Abd soft obese but   benign with nl excursion in the supine position. No bruits or organomegaly  Ext warm without calf tenderness, cyanosis clubbing or edema  GU testes down bilaterally. No IH  Rectal mild   bph, stool g neg  MS Scoliosis present, minimal effect on gait, no joint restrictions Neuro alert, sensorium intact, no  motor or cerebellar deficits Skin warm and dry, no lesions   Labs ordered/ reviewed:    Recent Labs Lab 02/07/15 0952  NA 141  K 4.5  CL 105  CO2 27  BUN 23  CREATININE 1.06  GLUCOSE 144*      Lab Results  Component Value Date   TSH 3.95 02/07/2015                   Assessment & Plan:

## 2015-02-10 ENCOUNTER — Encounter: Payer: Self-pay | Admitting: Internal Medicine

## 2015-02-10 NOTE — Assessment & Plan Note (Signed)
Adequate control on present rx, reviewed > no change in rx needed   

## 2015-02-10 NOTE — Assessment & Plan Note (Signed)
-   target < 130 HBP/male/no fm hx   Lab Results  Component Value Date   CHOL 166 02/07/2015   HDL 58.90 02/07/2015   LDLCALC 75 02/07/2015   TRIG 160.0* 02/07/2015   CHOLHDL 3 02/07/2015    Adequate control on present rx, reviewed > no change in rx needed   = lipitor 10 mg daily

## 2015-02-10 NOTE — Assessment & Plan Note (Addendum)
-   Target wt < 169 to get BMI < 30 - complicated by hyperlipidemia/ hypertensio  Body mass index is 30.48 kg/(m^2).  Lab Results  Component Value Date   TSH 3.95 02/07/2015     reviewed the need and the process to achieve and maintain neg calorie balance   Total time devoted to counseling  = 15/25 min ov   review case with pt/ discussion of options/alternatives/ giving and going over instructions (see avs)

## 2015-02-22 ENCOUNTER — Other Ambulatory Visit: Payer: Self-pay | Admitting: Internal Medicine

## 2015-02-22 MED ORDER — ATORVASTATIN CALCIUM 10 MG PO TABS: ORAL_TABLET | ORAL | Status: DC

## 2015-04-06 ENCOUNTER — Telehealth: Payer: Self-pay | Admitting: Internal Medicine

## 2015-04-06 NOTE — Telephone Encounter (Signed)
Called spoke with patient who reported that he did receive his flu shot the day of his last ov 11.1.16 > tdap was documented but pt reports he received 2 immunizations that visit.  Health maintenance updated Nothing further needed; will sign off

## 2015-04-25 ENCOUNTER — Other Ambulatory Visit: Payer: Self-pay | Admitting: Internal Medicine

## 2015-04-25 MED ORDER — AMLODIPINE BESYLATE-VALSARTAN 5-160 MG PO TABS: ORAL_TABLET | ORAL | Status: DC

## 2015-05-01 DIAGNOSIS — H52203 Unspecified astigmatism, bilateral: Secondary | ICD-10-CM | POA: Diagnosis not present

## 2015-05-01 DIAGNOSIS — H40011 Open angle with borderline findings, low risk, right eye: Secondary | ICD-10-CM | POA: Diagnosis not present

## 2015-05-01 DIAGNOSIS — Z961 Presence of intraocular lens: Secondary | ICD-10-CM | POA: Diagnosis not present

## 2016-01-01 DIAGNOSIS — Z85828 Personal history of other malignant neoplasm of skin: Secondary | ICD-10-CM | POA: Diagnosis not present

## 2016-01-01 DIAGNOSIS — L814 Other melanin hyperpigmentation: Secondary | ICD-10-CM | POA: Diagnosis not present

## 2016-01-01 DIAGNOSIS — D225 Melanocytic nevi of trunk: Secondary | ICD-10-CM | POA: Diagnosis not present

## 2016-01-01 DIAGNOSIS — L821 Other seborrheic keratosis: Secondary | ICD-10-CM | POA: Diagnosis not present

## 2016-01-01 DIAGNOSIS — D045 Carcinoma in situ of skin of trunk: Secondary | ICD-10-CM | POA: Diagnosis not present

## 2016-01-01 DIAGNOSIS — L738 Other specified follicular disorders: Secondary | ICD-10-CM | POA: Diagnosis not present

## 2016-01-01 DIAGNOSIS — D1801 Hemangioma of skin and subcutaneous tissue: Secondary | ICD-10-CM | POA: Diagnosis not present

## 2016-02-09 ENCOUNTER — Ambulatory Visit (INDEPENDENT_AMBULATORY_CARE_PROVIDER_SITE_OTHER)
Admission: RE | Admit: 2016-02-09 | Discharge: 2016-02-09 | Disposition: A | Discharge status: Home / Self Care | Payer: Medicare Other | Source: Ambulatory Visit | Attending: Internal Medicine | Admitting: Internal Medicine

## 2016-02-09 ENCOUNTER — Encounter: Payer: Self-pay | Admitting: Internal Medicine

## 2016-02-09 ENCOUNTER — Ambulatory Visit (INDEPENDENT_AMBULATORY_CARE_PROVIDER_SITE_OTHER): Payer: Medicare Other | Admitting: Internal Medicine

## 2016-02-09 ENCOUNTER — Other Ambulatory Visit (INDEPENDENT_AMBULATORY_CARE_PROVIDER_SITE_OTHER): Payer: Medicare Other

## 2016-02-09 VITALS — BP 124/74 | HR 79 | Ht 63.0 in | Wt 171.4 lb

## 2016-02-09 DIAGNOSIS — I10 Essential (primary) hypertension: Secondary | ICD-10-CM

## 2016-02-09 DIAGNOSIS — Z0000 Encounter for general adult medical examination without abnormal findings: Secondary | ICD-10-CM | POA: Diagnosis not present

## 2016-02-09 DIAGNOSIS — Z23 Encounter for immunization: Secondary | ICD-10-CM

## 2016-02-09 DIAGNOSIS — Z86010 Personal history of colonic polyps: Secondary | ICD-10-CM

## 2016-02-09 DIAGNOSIS — E7800 Pure hypercholesterolemia, unspecified: Secondary | ICD-10-CM

## 2016-02-09 LAB — BASIC METABOLIC PANEL
BUN: 25 mg/dL — ABNORMAL HIGH (ref 6–23)
CALCIUM: 9.6 mg/dL (ref 8.4–10.5)
CHLORIDE: 106 meq/L (ref 96–112)
CO2: 25 meq/L (ref 19–32)
Creatinine, Ser: 1.14 mg/dL (ref 0.40–1.50)
GFR: 65.27 mL/min (ref >60.00)
GLUCOSE: 132 mg/dL — AB (ref 70–99)
Potassium: 4.2 mEq/L (ref 3.5–5.1)
SODIUM: 141 meq/L (ref 135–145)

## 2016-02-09 LAB — CBC WITH DIFFERENTIAL/PLATELET
BASOS ABS: 0.1 10*3/uL (ref 0.0–0.1)
Basophils Relative: 1.3 % (ref 0.0–3.0)
EOS ABS: 0.3 10*3/uL (ref 0.0–0.7)
Eosinophils Relative: 3.1 % (ref 0.0–5.0)
HEMATOCRIT: 42.5 % (ref 39.0–52.0)
HEMOGLOBIN: 14.6 g/dL (ref 13.0–17.0)
LYMPHS PCT: 19.5 % (ref 12.0–46.0)
Lymphs Abs: 1.7 10*3/uL (ref 0.7–4.0)
MCHC: 34.4 g/dL (ref 30.0–36.0)
MCV: 88.5 fl (ref 78.0–100.0)
MONOS PCT: 8.5 % (ref 3.0–12.0)
Monocytes Absolute: 0.7 10*3/uL (ref 0.1–1.0)
NEUTROS ABS: 5.8 10*3/uL (ref 1.4–7.7)
Neutrophils Relative %: 67.6 % (ref 43.0–77.0)
PLATELETS: 260 10*3/uL (ref 150.0–400.0)
RBC: 4.8 Mil/uL (ref 4.22–5.81)
RDW: 13.3 % (ref 11.5–15.5)
WBC: 8.5 10*3/uL (ref 4.0–10.5)

## 2016-02-09 LAB — URINALYSIS
BILIRUBIN URINE: NEGATIVE
HGB URINE DIPSTICK: NEGATIVE
KETONES UR: NEGATIVE
LEUKOCYTES UA: NEGATIVE
NITRITE: NEGATIVE
Specific Gravity, Urine: 1.015 (ref 1.000–1.030)
Total Protein, Urine: NEGATIVE
UROBILINOGEN UA: 0.2 (ref 0.0–1.0)
Urine Glucose: NEGATIVE
pH: 6 (ref 5.0–8.0)

## 2016-02-09 LAB — TSH: TSH: 4.63 u[IU]/mL — AB (ref 0.35–4.50)

## 2016-02-09 LAB — HEPATIC FUNCTION PANEL
ALBUMIN: 4.6 g/dL (ref 3.5–5.2)
ALT: 22 U/L (ref 0–53)
AST: 19 U/L (ref 0–37)
Alkaline Phosphatase: 66 U/L (ref 39–117)
Bilirubin, Direct: 0 mg/dL (ref 0.0–0.3)
TOTAL PROTEIN: 7.3 g/dL (ref 6.0–8.3)
Total Bilirubin: 0.4 mg/dL (ref 0.2–1.2)

## 2016-02-09 LAB — LIPID PANEL
CHOL/HDL RATIO: 3
Cholesterol: 160 mg/dL (ref 0–200)
HDL: 54.8 mg/dL (ref >39.00)
NONHDL: 104.97
TRIGLYCERIDES: 202 mg/dL — AB (ref 0.0–149.0)
VLDL: 40.4 mg/dL — ABNORMAL HIGH (ref 0.0–40.0)

## 2016-02-09 LAB — LDL CHOLESTEROL, DIRECT: Direct LDL: 81 mg/dL

## 2016-02-09 NOTE — Progress Notes (Signed)
lmtcb

## 2016-02-09 NOTE — Progress Notes (Signed)
Spoke with pt and notified of results per Dr. [name]. Pt verbalized understanding and denied any questions. 

## 2016-02-09 NOTE — Progress Notes (Signed)
Subjective:    Patient ID: Bruce Dixon, male    DOB: Feb 06, 1932 .   MRN: [id number]    Brief patient profile:  82 yowm never smoker with hypertension and hyperlipidemia, associated with moderate centripetal obesity, with the target weight of 161 pounds.        02/09/2016  f/u ov/Jazae Gandolfi re: hbp/ hyperlipdemia/ scoliosis/ shoulder bilateral and L hip aleve helps  Chief Complaint  Patient presents with  . Annual Exam    Pt is fasting. Pt c/o leg cramps at night for the past 6 months.   cramps maybe once a week  At most, has not tried any thing for it/ no correlation with activity or assoc claudication  No obvious day to day or daytime variabilty or assoc sob or chronic cough or cp or chest tightness, subjective wheeze overt sinus or hb symptoms. No unusual exp hx or h/o childhood pna/ asthma or knowledge of premature birth.  Sleeping ok without nocturnal  or early am exacerbation  of respiratory  c/o's or need for noct saba. Also denies any obvious fluctuation of symptoms with weather or environmental changes or other aggravating or alleviating factors except as outlined above   Current Medications, Allergies, Complete Past Medical History, Past Surgical History, Family History, and Social History were reviewed in [employer] [facility] record.  ROS  The following are not active complaints unless bolded sore throat, dysphagia, dental problems, itching, sneezing,  nasal congestion or excess/ purulent secretions, ear ache,   fever, chills, sweats, unintended wt loss, pleuritic or exertional cp, hemoptysis,  orthopnea pnd or leg swelling, presyncope, palpitations, heartburn, abdominal pain, anorexia, nausea, vomiting, diarrhea  or change in bowel or urinary habits, change in stools or urine, dysuria,hematuria,  rash, arthralgias, visual complaints, headache, numbness weakness or ataxia or problems with walking or coordination,  change in mood/affect or memory.              Past  Medical History:  SCOLIOSIS (ICD-737.30)  COLONIC POLYPS, BENIGN, HX OF (ICD-V12.72)..............................Marland KitchenDeatra Ina  - Colonsocpopy 06/16/03  - Repeat colonsocopy 07/07/09 1) Two polyps in the descending colon  2) 3 mm sessile polyp in the sigmoid colon  3) Diverticula, scattered in the mid transverse colon  4 ) Path no sign dysplaisa  HYPERTENSION (ICD-401.9)  HYPERLIPIDEMIA (ICD-272.4)  - target < 130 HBP/male/no fm hx  HEALTH MAINTENANCE........................................................................Marland KitchenWert  -Td 02/2005  And 02/07/2015  - Pneumovax 12/1998 (age 82) , prevnar 01/31/2014  - CPX  02/09/2016   Family History:  only child  mother died at 44  father parkinsons dz  no premature heart dz    Social History:  Retired from newspaper  Occ ETOH  Never smoker  Lives in retirement center [place], eats buffet food             Objective:   Physical Exam   173 January 21, 2008 > 172 January 20, 2009  > 171 01/29/2011 > 01/28/2012  176 > 171 01/27/2013 > 01/31/2014  168 > 02/07/2015    172 > 02/09/2016  171   [name] in no acute distress/ vital signs reviewed    HEENT: nl dentition, turbinates, and orophanx. Ear canals   without cough reflex - R ear wax impaction Neck without JVD/Nodes/TM/carotids brisk without bruit  Lungs clear to A and P bilaterally without cough on insp or exp maneuvers/ mod scoliosis  RRR no s3 or murmur or increase in P2/ no displacement pmi  Abd soft obese but  benign with nl excursion in the supine position. No bruits or organomegaly  Ext warm without calf tenderness, cyanosis clubbing or edema  GU testes down bilaterally. No IH  Rectal mild   bph, stool g neg  MS Scoliosis present, minimal effect on gait, no joint restrictions Neuro alert, sensorium intact, no motor or cerebellar deficits Skin warm and dry, dime sized scab mid line around T6/8 level with nickle sized surrounding erythema (post op x one  month sq cell) clean s discharge    CXR PA and Lateral:   02/09/2016 :    I personally reviewed images and agree with radiology impression as follows:    There is no active cardiopulmonary disease. Stable left chest wall deformities and dextroscoliosis.   Labs ordered/ reviewed:      Chemistry      Component Value Date/Time   NA 141 02/09/2016 0922   K 4.2 02/09/2016 0922   CL 106 02/09/2016 0922   CO2 25 02/09/2016 0922   BUN 25 (H) 02/09/2016 0922   CREATININE 1.14 02/09/2016 0922      Component Value Date/Time   CALCIUM 9.6 02/09/2016 0922   ALKPHOS 66 02/09/2016 0922   AST 19 02/09/2016 0922   ALT 22 02/09/2016 0922   BILITOT 0.4 02/09/2016 0922        Lab Results  Component Value Date   WBC 8.5 02/09/2016   HGB 14.6 02/09/2016   HCT 42.5 02/09/2016   MCV 88.5 02/09/2016   PLT 260.0 02/09/2016        Lab Results  Component Value Date   TSH 4.63 (H) 02/09/2016                     Assessment & Plan:

## 2016-02-09 NOTE — Patient Instructions (Addendum)
Please remember to go to the lab and x-ray department downstairs for your tests - we will call you with the results when they are available.  Be sure to take aleve with meals and don't exceed what's written on the bottle - call if not improving  See your dermatologist after Dec 1 if the spot on your back hasn't healed up

## 2016-02-09 NOTE — Progress Notes (Signed)
LMTCB

## 2016-02-11 NOTE — Assessment & Plan Note (Signed)
-   Target wt < 169 to get BMI < 30 - complicated by hyperlipidemia/ hypertension  Body mass index is 30.36 and trending down slightly  Lab Results  Component Value Date   TSH 4.63 (H) 02/09/2016     Contributing to gerd tendency/ doe/reviewed the need and the process to achieve and maintain neg calorie balance > doubt he has significant hypothyroidism but will bring him back afte the xmas holdays for recheck

## 2016-02-11 NOTE — Assessment & Plan Note (Signed)
-   target < 130 HBP/male/no fm hx   Lab Results  Component Value Date   CHOL 160 02/09/2016   HDL 54.80 02/09/2016   LDLCALC 75 02/07/2015   LDLDIRECT 81.0 02/09/2016   TRIG 202.0 (H) 02/09/2016   CHOLHDL 3 02/09/2016     Adequate control on present rx, reviewed > no change in rx needed  / doubt once a week leg cramps due to lipitor but consider reduce to 5 mg if they continue

## 2016-02-11 NOTE — Assessment & Plan Note (Signed)
-   declined colonoscopy repeat 09/08/2013  (see GI ov)  Exam neg/ hgb stable > no change in recs

## 2016-02-11 NOTE — Assessment & Plan Note (Signed)
-   rx exforge 160/5 daily 08/21/2010 for persistent elevations   Adequate control on present rx, reviewed > no change in rx needed

## 2016-02-12 ENCOUNTER — Telehealth: Payer: Self-pay | Admitting: Internal Medicine

## 2016-02-12 NOTE — Telephone Encounter (Signed)
lmtcb for pt.   Notes Recorded by Tanda Rockers, MD on 02/11/2016 at 6:02 AM EST Call patient : Studies are ok x TSH borderline up so needs repeat p [place]'s - no need for ov

## 2016-02-12 NOTE — Progress Notes (Signed)
LMTCB

## 2016-02-12 NOTE — Progress Notes (Signed)
Please see phone note from 11.6.17. Will sign off.

## 2016-02-13 NOTE — Telephone Encounter (Signed)
Spoke with pt, aware of results/recs.  Nothing further needed.  

## 2016-02-13 NOTE — Telephone Encounter (Signed)
[phone] pt calling back

## 2016-02-19 ENCOUNTER — Other Ambulatory Visit: Payer: Self-pay | Admitting: Internal Medicine

## 2016-02-19 MED ORDER — ATORVASTATIN CALCIUM 10 MG PO TABS: ORAL_TABLET | ORAL | 11 refills | Status: AC

## 2016-04-16 ENCOUNTER — Other Ambulatory Visit: Payer: Self-pay | Admitting: Internal Medicine

## 2016-04-16 MED ORDER — AMLODIPINE BESYLATE-VALSARTAN 5-160 MG PO TABS: ORAL_TABLET | ORAL | 11 refills | Status: DC

## 2016-04-22 DIAGNOSIS — H40011 Open angle with borderline findings, low risk, right eye: Secondary | ICD-10-CM | POA: Diagnosis not present

## 2016-04-22 DIAGNOSIS — H52203 Unspecified astigmatism, bilateral: Secondary | ICD-10-CM | POA: Diagnosis not present

## 2016-04-22 DIAGNOSIS — Z961 Presence of intraocular lens: Secondary | ICD-10-CM | POA: Diagnosis not present

## 2016-11-12 DIAGNOSIS — I10 Essential (primary) hypertension: Secondary | ICD-10-CM | POA: Diagnosis not present

## 2016-11-12 DIAGNOSIS — E782 Mixed hyperlipidemia: Secondary | ICD-10-CM | POA: Diagnosis not present

## 2016-11-14 DIAGNOSIS — M25559 Pain in unspecified hip: Secondary | ICD-10-CM | POA: Diagnosis not present

## 2016-11-14 DIAGNOSIS — I10 Essential (primary) hypertension: Secondary | ICD-10-CM | POA: Diagnosis not present

## 2016-11-14 DIAGNOSIS — E782 Mixed hyperlipidemia: Secondary | ICD-10-CM | POA: Diagnosis not present

## 2016-12-27 DIAGNOSIS — R0981 Nasal congestion: Secondary | ICD-10-CM | POA: Diagnosis not present

## 2016-12-27 DIAGNOSIS — R05 Cough: Secondary | ICD-10-CM | POA: Diagnosis not present

## 2016-12-31 DIAGNOSIS — D0471 Carcinoma in situ of skin of right lower limb, including hip: Secondary | ICD-10-CM | POA: Diagnosis not present

## 2016-12-31 DIAGNOSIS — Z85828 Personal history of other malignant neoplasm of skin: Secondary | ICD-10-CM | POA: Diagnosis not present

## 2016-12-31 DIAGNOSIS — L309 Dermatitis, unspecified: Secondary | ICD-10-CM | POA: Diagnosis not present

## 2016-12-31 DIAGNOSIS — D225 Melanocytic nevi of trunk: Secondary | ICD-10-CM | POA: Diagnosis not present

## 2016-12-31 DIAGNOSIS — L570 Actinic keratosis: Secondary | ICD-10-CM | POA: Diagnosis not present

## 2016-12-31 DIAGNOSIS — D1801 Hemangioma of skin and subcutaneous tissue: Secondary | ICD-10-CM | POA: Diagnosis not present

## 2016-12-31 DIAGNOSIS — L814 Other melanin hyperpigmentation: Secondary | ICD-10-CM | POA: Diagnosis not present

## 2017-01-06 DIAGNOSIS — Z23 Encounter for immunization: Secondary | ICD-10-CM | POA: Diagnosis not present

## 2017-02-05 DIAGNOSIS — I10 Essential (primary) hypertension: Secondary | ICD-10-CM | POA: Diagnosis not present

## 2017-02-05 DIAGNOSIS — Z0000 Encounter for general adult medical examination without abnormal findings: Secondary | ICD-10-CM | POA: Diagnosis not present

## 2017-02-05 DIAGNOSIS — E785 Hyperlipidemia, unspecified: Secondary | ICD-10-CM | POA: Diagnosis not present

## 2017-02-05 DIAGNOSIS — R269 Unspecified abnormalities of gait and mobility: Secondary | ICD-10-CM | POA: Diagnosis not present

## 2017-02-05 DIAGNOSIS — M8580 Other specified disorders of bone density and structure, unspecified site: Secondary | ICD-10-CM | POA: Diagnosis not present

## 2017-02-05 DIAGNOSIS — E559 Vitamin D deficiency, unspecified: Secondary | ICD-10-CM | POA: Diagnosis not present

## 2017-02-10 ENCOUNTER — Ambulatory Visit: Payer: Medicare Other | Admitting: Internal Medicine

## 2017-02-10 DIAGNOSIS — I10 Essential (primary) hypertension: Secondary | ICD-10-CM | POA: Diagnosis not present

## 2017-02-10 DIAGNOSIS — Z0000 Encounter for general adult medical examination without abnormal findings: Secondary | ICD-10-CM | POA: Diagnosis not present

## 2017-02-10 DIAGNOSIS — R7301 Impaired fasting glucose: Secondary | ICD-10-CM | POA: Diagnosis not present

## 2017-02-10 DIAGNOSIS — Z23 Encounter for immunization: Secondary | ICD-10-CM | POA: Diagnosis not present

## 2017-02-10 DIAGNOSIS — E782 Mixed hyperlipidemia: Secondary | ICD-10-CM | POA: Diagnosis not present

## 2017-02-24 DIAGNOSIS — I10 Essential (primary) hypertension: Secondary | ICD-10-CM | POA: Diagnosis not present

## 2017-04-22 DIAGNOSIS — Z961 Presence of intraocular lens: Secondary | ICD-10-CM | POA: Diagnosis not present

## 2017-04-22 DIAGNOSIS — H52203 Unspecified astigmatism, bilateral: Secondary | ICD-10-CM | POA: Diagnosis not present

## 2017-06-11 DIAGNOSIS — Z0189 Encounter for other specified special examinations: Secondary | ICD-10-CM | POA: Diagnosis not present

## 2017-06-11 DIAGNOSIS — R7301 Impaired fasting glucose: Secondary | ICD-10-CM | POA: Diagnosis not present

## 2017-06-11 DIAGNOSIS — I10 Essential (primary) hypertension: Secondary | ICD-10-CM | POA: Diagnosis not present

## 2017-06-11 DIAGNOSIS — E785 Hyperlipidemia, unspecified: Secondary | ICD-10-CM | POA: Diagnosis not present

## 2017-06-11 DIAGNOSIS — E559 Vitamin D deficiency, unspecified: Secondary | ICD-10-CM | POA: Diagnosis not present

## 2017-06-12 DIAGNOSIS — E1122 Type 2 diabetes mellitus with diabetic chronic kidney disease: Secondary | ICD-10-CM | POA: Diagnosis not present

## 2017-06-12 DIAGNOSIS — R7301 Impaired fasting glucose: Secondary | ICD-10-CM | POA: Diagnosis not present

## 2017-06-12 DIAGNOSIS — I129 Hypertensive chronic kidney disease with stage 1 through stage 4 chronic kidney disease, or unspecified chronic kidney disease: Secondary | ICD-10-CM | POA: Diagnosis not present

## 2017-06-12 DIAGNOSIS — N181 Chronic kidney disease, stage 1: Secondary | ICD-10-CM | POA: Diagnosis not present

## 2017-08-07 IMAGING — DX DG CHEST 2V
2 series · 2 of 2 positions shown · non-contrast
Comparison: PA and lateral chest x-ray February 07, 2015

CLINICAL DATA: Asymptomatic, routine examination. History of
hypertension, hyperlipidemia, and scoliosis

EXAM:
CHEST  2 VIEW

[chest pa]
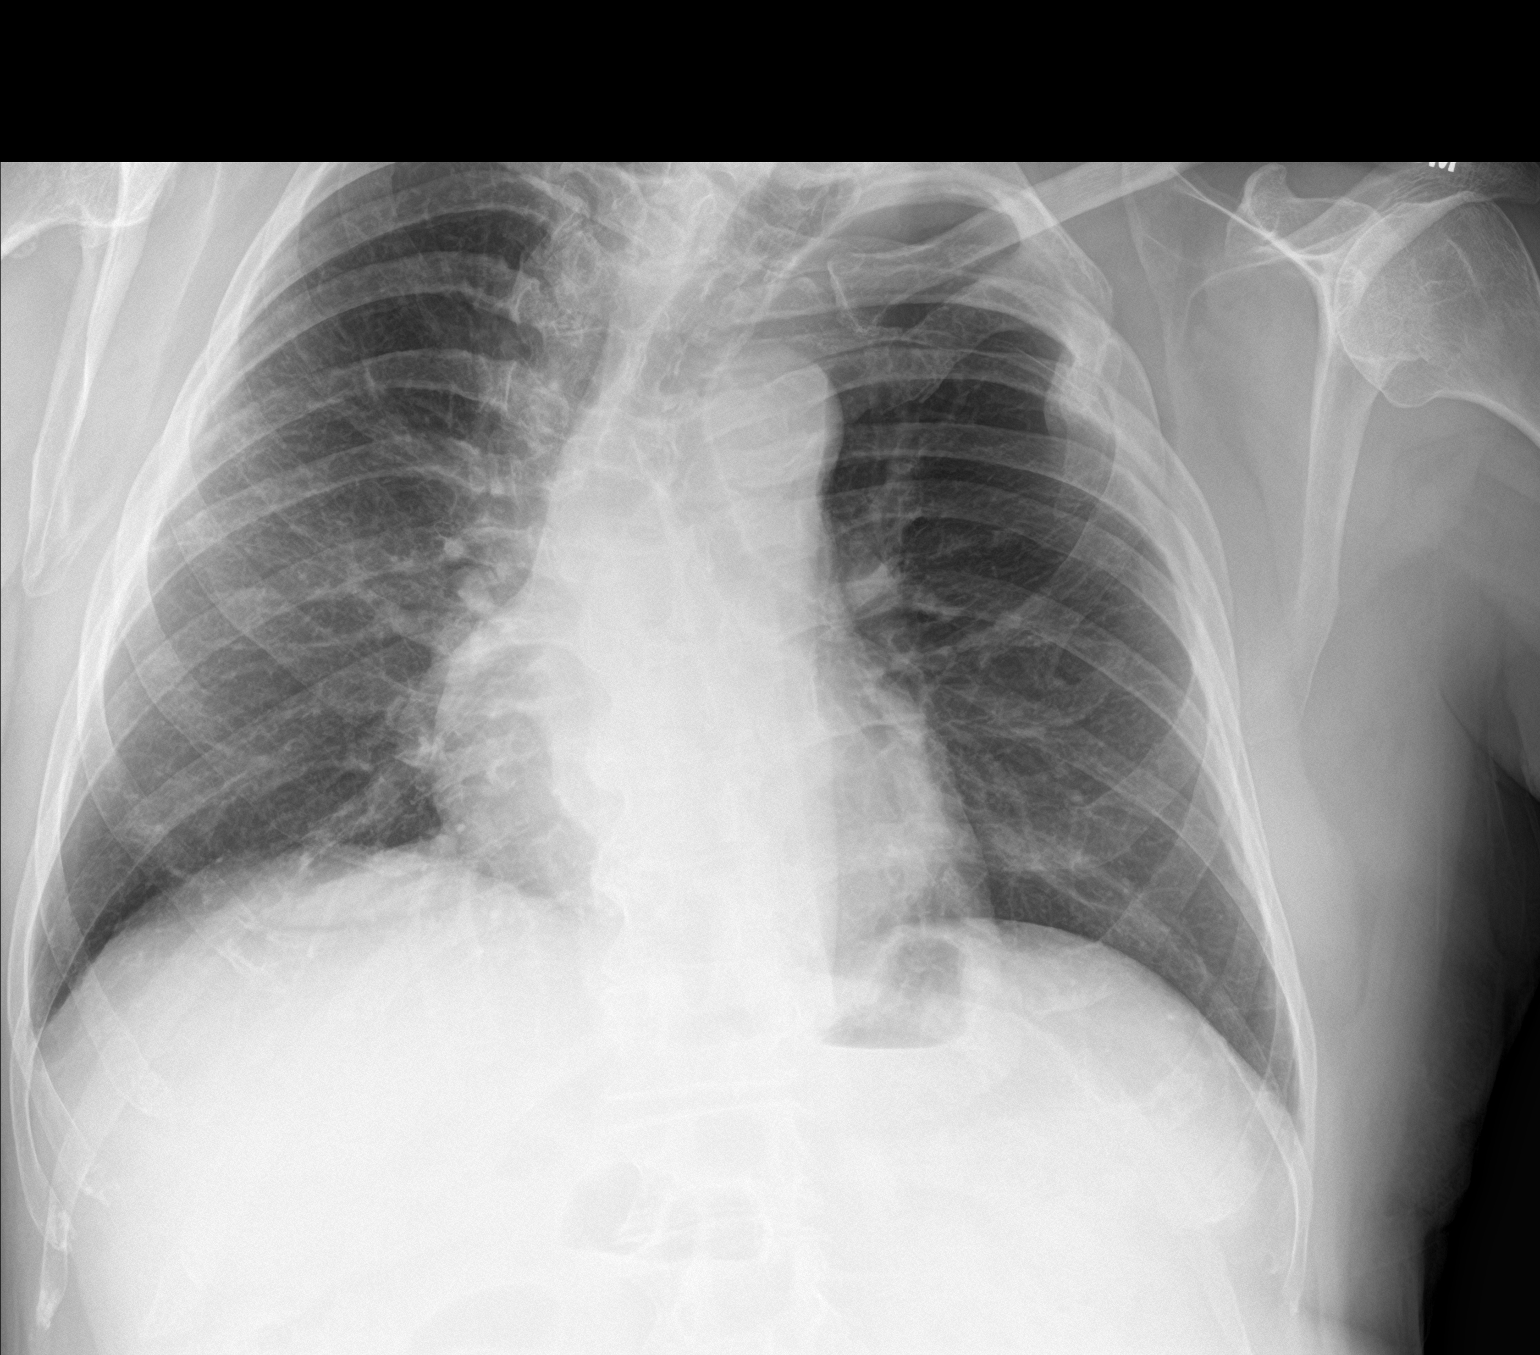

[chest lat]
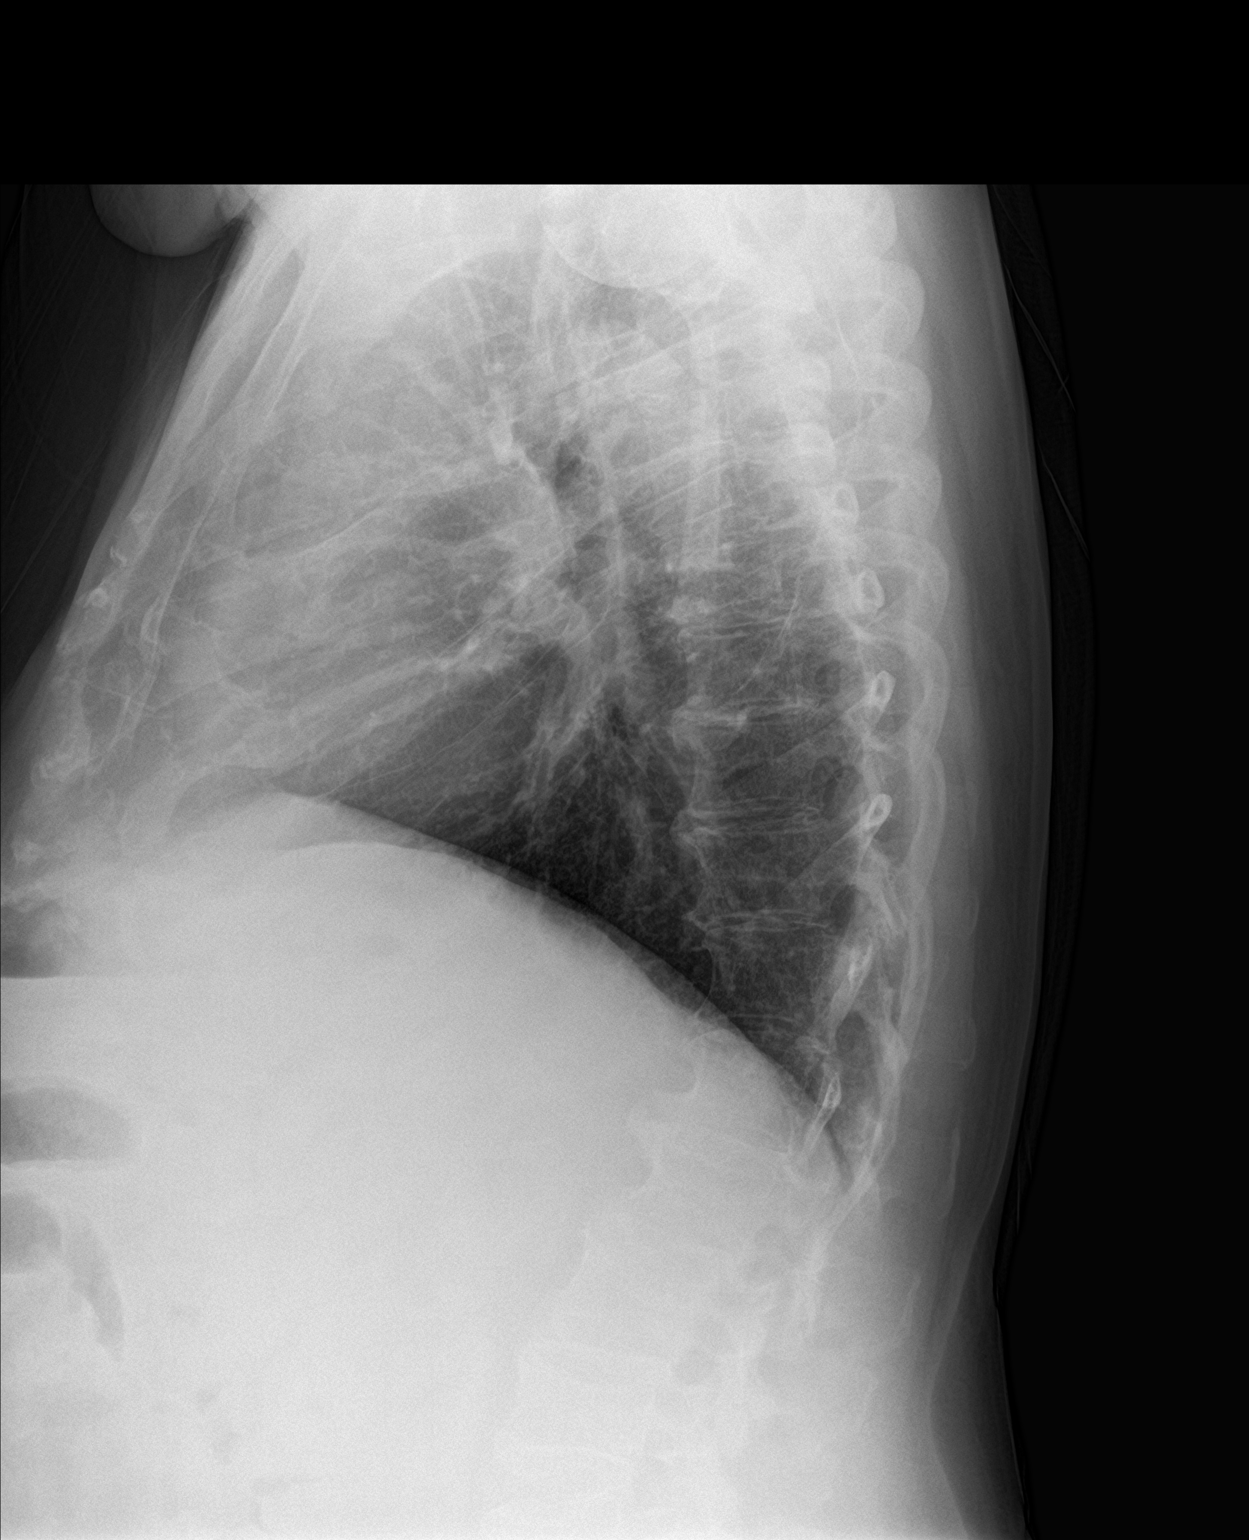

[2 of 2 positions shown; findings below may reference images not displayed]

FINDINGS: The lungs are well-expanded. There is chronic deformity of the upper
left ribs. There is no alveolar infiltrate or pleural effusion. The
heart and pulmonary vascularity are normal. There is calcification
in the wall of the aortic arch. There is moderate levocurvature
centered in the mid upper thoracic spine which is stable.
IMPRESSION: There is no active cardiopulmonary disease.

Stable left chest wall deformities and dextroscoliosis.

## 2017-09-10 DIAGNOSIS — Z0189 Encounter for other specified special examinations: Secondary | ICD-10-CM | POA: Diagnosis not present

## 2017-09-10 DIAGNOSIS — I10 Essential (primary) hypertension: Secondary | ICD-10-CM | POA: Diagnosis not present

## 2017-09-10 DIAGNOSIS — E782 Mixed hyperlipidemia: Secondary | ICD-10-CM | POA: Diagnosis not present

## 2017-09-10 DIAGNOSIS — R7301 Impaired fasting glucose: Secondary | ICD-10-CM | POA: Diagnosis not present

## 2017-09-10 DIAGNOSIS — Z0000 Encounter for general adult medical examination without abnormal findings: Secondary | ICD-10-CM | POA: Diagnosis not present

## 2017-09-11 DIAGNOSIS — E1122 Type 2 diabetes mellitus with diabetic chronic kidney disease: Secondary | ICD-10-CM | POA: Diagnosis not present

## 2017-09-11 DIAGNOSIS — N181 Chronic kidney disease, stage 1: Secondary | ICD-10-CM | POA: Diagnosis not present

## 2017-09-11 DIAGNOSIS — I129 Hypertensive chronic kidney disease with stage 1 through stage 4 chronic kidney disease, or unspecified chronic kidney disease: Secondary | ICD-10-CM | POA: Diagnosis not present

## 2017-12-10 DIAGNOSIS — E782 Mixed hyperlipidemia: Secondary | ICD-10-CM | POA: Diagnosis not present

## 2017-12-10 DIAGNOSIS — Z7984 Long term (current) use of oral hypoglycemic drugs: Secondary | ICD-10-CM | POA: Diagnosis not present

## 2017-12-10 DIAGNOSIS — N181 Chronic kidney disease, stage 1: Secondary | ICD-10-CM | POA: Diagnosis not present

## 2017-12-10 DIAGNOSIS — Z0189 Encounter for other specified special examinations: Secondary | ICD-10-CM | POA: Diagnosis not present

## 2017-12-10 DIAGNOSIS — E1122 Type 2 diabetes mellitus with diabetic chronic kidney disease: Secondary | ICD-10-CM | POA: Diagnosis not present

## 2017-12-10 DIAGNOSIS — I129 Hypertensive chronic kidney disease with stage 1 through stage 4 chronic kidney disease, or unspecified chronic kidney disease: Secondary | ICD-10-CM | POA: Diagnosis not present

## 2017-12-16 DIAGNOSIS — N182 Chronic kidney disease, stage 2 (mild): Secondary | ICD-10-CM | POA: Diagnosis not present

## 2017-12-16 DIAGNOSIS — E559 Vitamin D deficiency, unspecified: Secondary | ICD-10-CM | POA: Diagnosis not present

## 2017-12-16 DIAGNOSIS — E782 Mixed hyperlipidemia: Secondary | ICD-10-CM | POA: Diagnosis not present

## 2017-12-16 DIAGNOSIS — E1122 Type 2 diabetes mellitus with diabetic chronic kidney disease: Secondary | ICD-10-CM | POA: Diagnosis not present

## 2017-12-16 DIAGNOSIS — Z6829 Body mass index (BMI) 29.0-29.9, adult: Secondary | ICD-10-CM | POA: Diagnosis not present

## 2017-12-16 DIAGNOSIS — I129 Hypertensive chronic kidney disease with stage 1 through stage 4 chronic kidney disease, or unspecified chronic kidney disease: Secondary | ICD-10-CM | POA: Diagnosis not present

## 2017-12-29 DIAGNOSIS — Z23 Encounter for immunization: Secondary | ICD-10-CM | POA: Diagnosis not present

## 2017-12-30 DIAGNOSIS — D1801 Hemangioma of skin and subcutaneous tissue: Secondary | ICD-10-CM | POA: Diagnosis not present

## 2017-12-30 DIAGNOSIS — D0462 Carcinoma in situ of skin of left upper limb, including shoulder: Secondary | ICD-10-CM | POA: Diagnosis not present

## 2017-12-30 DIAGNOSIS — L821 Other seborrheic keratosis: Secondary | ICD-10-CM | POA: Diagnosis not present

## 2017-12-30 DIAGNOSIS — Z85828 Personal history of other malignant neoplasm of skin: Secondary | ICD-10-CM | POA: Diagnosis not present

## 2017-12-30 DIAGNOSIS — L570 Actinic keratosis: Secondary | ICD-10-CM | POA: Diagnosis not present

## 2018-03-18 DIAGNOSIS — Z0189 Encounter for other specified special examinations: Secondary | ICD-10-CM | POA: Diagnosis not present

## 2018-03-18 DIAGNOSIS — N182 Chronic kidney disease, stage 2 (mild): Secondary | ICD-10-CM | POA: Diagnosis not present

## 2018-03-18 DIAGNOSIS — E1122 Type 2 diabetes mellitus with diabetic chronic kidney disease: Secondary | ICD-10-CM | POA: Diagnosis not present

## 2018-03-24 DIAGNOSIS — E1129 Type 2 diabetes mellitus with other diabetic kidney complication: Secondary | ICD-10-CM | POA: Diagnosis not present

## 2018-03-24 DIAGNOSIS — Z0000 Encounter for general adult medical examination without abnormal findings: Secondary | ICD-10-CM | POA: Diagnosis not present

## 2018-03-24 DIAGNOSIS — E559 Vitamin D deficiency, unspecified: Secondary | ICD-10-CM | POA: Diagnosis not present

## 2018-03-24 DIAGNOSIS — I10 Essential (primary) hypertension: Secondary | ICD-10-CM | POA: Diagnosis not present

## 2018-03-24 DIAGNOSIS — E782 Mixed hyperlipidemia: Secondary | ICD-10-CM | POA: Diagnosis not present

## 2024-04-01 ENCOUNTER — Emergency Department (HOSPITAL_COMMUNITY)

## 2024-04-01 ENCOUNTER — Encounter (HOSPITAL_COMMUNITY): Payer: Self-pay

## 2024-04-01 ENCOUNTER — Inpatient Hospital Stay (HOSPITAL_COMMUNITY)
Admission: EM | Admit: 2024-04-01 | Discharge: 2024-04-05 | DRG: 872 | Disposition: A | Discharge status: Skilled Nursing Facility | Source: Skilled Nursing Facility | Attending: Internal Medicine | Admitting: Internal Medicine

## 2024-04-01 ENCOUNTER — Other Ambulatory Visit: Payer: Self-pay

## 2024-04-01 DIAGNOSIS — I2510 Atherosclerotic heart disease of native coronary artery without angina pectoris: Secondary | ICD-10-CM | POA: Diagnosis present

## 2024-04-01 DIAGNOSIS — M419 Scoliosis, unspecified: Secondary | ICD-10-CM | POA: Diagnosis present

## 2024-04-01 DIAGNOSIS — R7881 Bacteremia: Secondary | ICD-10-CM | POA: Diagnosis present

## 2024-04-01 DIAGNOSIS — E039 Hypothyroidism, unspecified: Secondary | ICD-10-CM | POA: Diagnosis present

## 2024-04-01 DIAGNOSIS — S50819A Abrasion of unspecified forearm, initial encounter: Secondary | ICD-10-CM | POA: Diagnosis present

## 2024-04-01 DIAGNOSIS — Z7982 Long term (current) use of aspirin: Secondary | ICD-10-CM

## 2024-04-01 DIAGNOSIS — E785 Hyperlipidemia, unspecified: Secondary | ICD-10-CM | POA: Diagnosis present

## 2024-04-01 DIAGNOSIS — A419 Sepsis, unspecified organism: Secondary | ICD-10-CM | POA: Diagnosis not present

## 2024-04-01 DIAGNOSIS — R5381 Other malaise: Secondary | ICD-10-CM | POA: Diagnosis present

## 2024-04-01 DIAGNOSIS — Z1152 Encounter for screening for COVID-19: Secondary | ICD-10-CM

## 2024-04-01 DIAGNOSIS — A4159 Other Gram-negative sepsis: Principal | ICD-10-CM | POA: Diagnosis present

## 2024-04-01 DIAGNOSIS — Z9842 Cataract extraction status, left eye: Secondary | ICD-10-CM | POA: Diagnosis not present

## 2024-04-01 DIAGNOSIS — E538 Deficiency of other specified B group vitamins: Secondary | ICD-10-CM | POA: Diagnosis present

## 2024-04-01 DIAGNOSIS — M19012 Primary osteoarthritis, left shoulder: Secondary | ICD-10-CM | POA: Diagnosis present

## 2024-04-01 DIAGNOSIS — Z79899 Other long term (current) drug therapy: Secondary | ICD-10-CM

## 2024-04-01 DIAGNOSIS — Z9841 Cataract extraction status, right eye: Secondary | ICD-10-CM | POA: Diagnosis not present

## 2024-04-01 DIAGNOSIS — Z79890 Hormone replacement therapy (postmenopausal): Secondary | ICD-10-CM | POA: Diagnosis not present

## 2024-04-01 DIAGNOSIS — W19XXXA Unspecified fall, initial encounter: Secondary | ICD-10-CM | POA: Diagnosis present

## 2024-04-01 DIAGNOSIS — R5383 Other fatigue: Secondary | ICD-10-CM | POA: Diagnosis present

## 2024-04-01 DIAGNOSIS — Z860100 Personal history of colon polyps, unspecified: Secondary | ICD-10-CM

## 2024-04-01 DIAGNOSIS — R531 Weakness: Secondary | ICD-10-CM | POA: Diagnosis present

## 2024-04-01 DIAGNOSIS — N3000 Acute cystitis without hematuria: Secondary | ICD-10-CM | POA: Diagnosis present

## 2024-04-01 DIAGNOSIS — I10 Essential (primary) hypertension: Secondary | ICD-10-CM | POA: Diagnosis present

## 2024-04-01 LAB — COMPREHENSIVE METABOLIC PANEL WITH GFR
ALT: 10 U/L (ref 0–44)
AST: 26 U/L (ref 15–41)
Albumin: 4.3 g/dL (ref 3.5–5.0)
Alkaline Phosphatase: 73 U/L (ref 38–126)
Anion gap: 13 (ref 5–15)
BUN: 24 mg/dL — ABNORMAL HIGH (ref 8–23)
CO2: 19 mmol/L — ABNORMAL LOW (ref 22–32)
Calcium: 9.4 mg/dL (ref 8.9–10.3)
Chloride: 104 mmol/L (ref 98–111)
Creatinine, Ser: 1.31 mg/dL — ABNORMAL HIGH (ref 0.61–1.24)
GFR, Estimated: 52 mL/min — ABNORMAL LOW
Glucose, Bld: 156 mg/dL — ABNORMAL HIGH (ref 70–99)
Potassium: 4.2 mmol/L (ref 3.5–5.1)
Sodium: 135 mmol/L (ref 135–145)
Total Bilirubin: 0.5 mg/dL (ref 0.0–1.2)
Total Protein: 7.1 g/dL (ref 6.5–8.1)

## 2024-04-01 LAB — CREATININE, SERUM
Creatinine, Ser: 1.2 mg/dL (ref 0.61–1.24)
GFR, Estimated: 57 mL/min — ABNORMAL LOW

## 2024-04-01 LAB — URINALYSIS, W/ REFLEX TO CULTURE (INFECTION SUSPECTED)
Bilirubin Urine: NEGATIVE
Glucose, UA: NEGATIVE mg/dL
Ketones, ur: NEGATIVE mg/dL
Nitrite: POSITIVE — AB
Protein, ur: NEGATIVE mg/dL
Specific Gravity, Urine: 1.011 (ref 1.005–1.030)
pH: 5 (ref 5.0–8.0)

## 2024-04-01 LAB — CBC WITH DIFFERENTIAL/PLATELET
Abs Immature Granulocytes: 0.45 K/uL — ABNORMAL HIGH (ref 0.00–0.07)
Basophils Absolute: 0.1 K/uL (ref 0.0–0.1)
Basophils Relative: 0 %
Eosinophils Absolute: 0 K/uL (ref 0.0–0.5)
Eosinophils Relative: 0 %
HCT: 34.7 % — ABNORMAL LOW (ref 39.0–52.0)
Hemoglobin: 11.8 g/dL — ABNORMAL LOW (ref 13.0–17.0)
Immature Granulocytes: 2 %
Lymphocytes Relative: 2 %
Lymphs Abs: 0.5 K/uL — ABNORMAL LOW (ref 0.7–4.0)
MCH: 30.8 pg (ref 26.0–34.0)
MCHC: 34 g/dL (ref 30.0–36.0)
MCV: 90.6 fL (ref 80.0–100.0)
Monocytes Absolute: 2.5 K/uL — ABNORMAL HIGH (ref 0.1–1.0)
Monocytes Relative: 8 %
Neutro Abs: 26.7 K/uL — ABNORMAL HIGH (ref 1.7–7.7)
Neutrophils Relative %: 88 %
Platelets: 200 K/uL (ref 150–400)
RBC: 3.83 MIL/uL — ABNORMAL LOW (ref 4.22–5.81)
RDW: 14 % (ref 11.5–15.5)
WBC: 30.3 K/uL — ABNORMAL HIGH (ref 4.0–10.5)
nRBC: 0 % (ref 0.0–0.2)

## 2024-04-01 LAB — CBC
HCT: 32.7 % — ABNORMAL LOW (ref 39.0–52.0)
Hemoglobin: 11 g/dL — ABNORMAL LOW (ref 13.0–17.0)
MCH: 30.6 pg (ref 26.0–34.0)
MCHC: 33.6 g/dL (ref 30.0–36.0)
MCV: 91.1 fL (ref 80.0–100.0)
Platelets: 178 K/uL (ref 150–400)
RBC: 3.59 MIL/uL — ABNORMAL LOW (ref 4.22–5.81)
RDW: 13.9 % (ref 11.5–15.5)
WBC: 27.9 K/uL — ABNORMAL HIGH (ref 4.0–10.5)
nRBC: 0 % (ref 0.0–0.2)

## 2024-04-01 LAB — RESP PANEL BY RT-PCR (RSV, FLU A&B, COVID)  RVPGX2
Influenza A by PCR: NEGATIVE
Influenza B by PCR: NEGATIVE
Resp Syncytial Virus by PCR: NEGATIVE
SARS Coronavirus 2 by RT PCR: NEGATIVE

## 2024-04-01 LAB — I-STAT CG4 LACTIC ACID, ED: Lactic Acid, Venous: 1.1 mmol/L (ref 0.5–1.9)

## 2024-04-01 MED ORDER — ASPIRIN 81 MG PO TBEC
81.0000 mg | DELAYED_RELEASE_TABLET | Freq: Every day | ORAL | Status: DC
  Administered 2024-04-02 – 2024-04-05 (×4): 81 mg via ORAL
  Filled 2024-04-01 (×4): qty 1

## 2024-04-01 MED ORDER — LEVOTHYROXINE SODIUM 25 MCG PO TABS
25.0000 ug | ORAL_TABLET | Freq: Every day | ORAL | Status: DC
  Administered 2024-04-02 – 2024-04-05 (×4): 25 ug via ORAL
  Filled 2024-04-01 (×3): qty 1

## 2024-04-01 MED ORDER — IRBESARTAN 150 MG PO TABS
150.0000 mg | ORAL_TABLET | Freq: Every day | ORAL | Status: DC
  Administered 2024-04-02 – 2024-04-05 (×4): 150 mg via ORAL
  Filled 2024-04-01 (×5): qty 1

## 2024-04-01 MED ORDER — ATORVASTATIN CALCIUM 10 MG PO TABS
10.0000 mg | ORAL_TABLET | Freq: Every evening | ORAL | Status: DC
  Administered 2024-04-01 – 2024-04-04 (×4): 10 mg via ORAL
  Filled 2024-04-01 (×4): qty 1

## 2024-04-01 MED ORDER — AMLODIPINE BESYLATE 10 MG PO TABS
10.0000 mg | ORAL_TABLET | Freq: Every day | ORAL | Status: DC
  Administered 2024-04-01 – 2024-04-04 (×4): 10 mg via ORAL
  Filled 2024-04-01: qty 1
  Filled 2024-04-01: qty 2
  Filled 2024-04-01 (×2): qty 1

## 2024-04-01 MED ORDER — SODIUM CHLORIDE 0.9 % IV BOLUS
1000.0000 mL | Freq: Once | INTRAVENOUS | Status: AC
  Administered 2024-04-01: 1000 mL via INTRAVENOUS

## 2024-04-01 MED ORDER — ACETAMINOPHEN 650 MG RE SUPP: 650.0000 mg | Freq: Four times a day (QID) | RECTAL | Status: DC | PRN

## 2024-04-01 MED ORDER — ACETAMINOPHEN 500 MG PO TABS
500.0000 mg | ORAL_TABLET | Freq: Every day | ORAL | Status: DC
  Administered 2024-04-02 – 2024-04-04 (×3): 500 mg via ORAL
  Filled 2024-04-01 (×3): qty 1

## 2024-04-01 MED ORDER — ACETAMINOPHEN 325 MG PO TABS
650.0000 mg | ORAL_TABLET | Freq: Once | ORAL | Status: AC
  Administered 2024-04-01: 650 mg via ORAL
  Filled 2024-04-01: qty 2

## 2024-04-01 MED ORDER — SODIUM CHLORIDE 0.9 % IV SOLN
2.0000 g | Freq: Once | INTRAVENOUS | Status: AC
  Administered 2024-04-01: 2 g via INTRAVENOUS
  Filled 2024-04-01: qty 12.5

## 2024-04-01 MED ORDER — SODIUM CHLORIDE 0.9 % IV SOLN
2.0000 g | INTRAVENOUS | Status: DC
  Administered 2024-04-02 – 2024-04-04 (×3): 2 g via INTRAVENOUS
  Filled 2024-04-01 (×3): qty 20

## 2024-04-01 MED ORDER — ONDANSETRON HCL 4 MG/2ML IJ SOLN: 4.0000 mg | Freq: Four times a day (QID) | INTRAMUSCULAR | Status: DC | PRN

## 2024-04-01 MED ORDER — ONDANSETRON HCL 4 MG PO TABS: 4.0000 mg | ORAL_TABLET | Freq: Four times a day (QID) | ORAL | Status: DC | PRN

## 2024-04-01 MED ORDER — OXYCODONE HCL 5 MG PO TABS: 5.0000 mg | ORAL_TABLET | ORAL | Status: DC | PRN

## 2024-04-01 MED ORDER — ACETAMINOPHEN 325 MG PO TABS: 650.0000 mg | ORAL_TABLET | Freq: Four times a day (QID) | ORAL | Status: DC | PRN

## 2024-04-01 MED ORDER — ENOXAPARIN SODIUM 40 MG/0.4ML IJ SOSY
40.0000 mg | PREFILLED_SYRINGE | INTRAMUSCULAR | Status: DC
  Administered 2024-04-01 – 2024-04-04 (×4): 40 mg via SUBCUTANEOUS
  Filled 2024-04-01 (×4): qty 0.4

## 2024-04-01 NOTE — ED Provider Notes (Signed)
 " [place] EMERGENCY DEPARTMENT AT [facility] Provider Note   CSN: [id number] Arrival date & time: 04/01/24  1649     Patient presents with: Fall and Weakness   Bruce Dixon is a 89 y.o. male history of hypertension here presenting with weakness and fall.  Patient is from [employer].  Patient states that he has been feeling weak for several days.  Patient states that after lunch he just slid down and fell onto the left forearm.  Denies any head injury or loss of consciousness.  Patient was noted to have abrasion in the left forearm.  Denies any sick contacts.  Has some nonproductive cough for several days as well.  He states that he also has frequent urination as well   The history is provided by the patient.       Prior to Admission medications  Medication Sig Start Date End Date Taking? Authorizing Provider  amLODipine -valsartan  (EXFORGE ) 5-160 MG tablet TAKE ONE (1) TABLET BY MOUTH EVERY DAY 04/16/16   Darlean Ozell NOVAK, MD  aspirin  81 MG tablet Take 81 mg by mouth daily.     [provider]  atorvastatin  (LIPITOR) 10 MG tablet 1/2 tablet daily 02/19/16   Darlean Ozell NOVAK, MD  Multiple Vitamins-Minerals (CENTRUM SILVER) tablet Take 1 tablet by mouth daily.    [provider]  VITAMIN D , CHOLECALCIFEROL, PO Take 1 tablet by mouth every 7 (seven) days.    [provider]    Allergies: Patient has no known allergies.    Review of Systems  Constitutional:  Positive for chills.  Neurological:  Positive for weakness.  All other systems reviewed and are negative.   Updated Vital Signs BP 138/67   Pulse (!) 111   Temp 98.6 F (37 C)   Resp 18   Ht 5' 3 (1.6 m)   Wt 74.4 kg   SpO2 95%   BMI 29.05 kg/m   Physical Exam Vitals and nursing note reviewed.  Constitutional:      Comments: Slightly dehydrated  HENT:     Head: Normocephalic and atraumatic.     Comments: No obvious scalp hematoma    Nose: Nose normal.     Mouth/Throat:      Mouth: Mucous membranes are dry.  Eyes:     Extraocular Movements: Extraocular movements intact.     Pupils: Pupils are equal, round, and reactive to light.  Cardiovascular:     Rate and Rhythm: Normal rate and regular rhythm.     Pulses: Normal pulses.     Heart sounds: Normal heart sounds.  Pulmonary:     Effort: Pulmonary effort is normal.     Breath sounds: Normal breath sounds.  Abdominal:     General: Abdomen is flat.     Palpations: Abdomen is soft.  Musculoskeletal:     Cervical back: Normal range of motion and neck supple.     Comments: Skin tear in the left forearm.  Patient has no spinal tenderness and normal range of motion bilateral hips.  Patient has no other extremity trauma  Skin:    General: Skin is warm.     Capillary Refill: Capillary refill takes less than 2 seconds.  Neurological:     General: No focal deficit present.     Mental Status: He is alert and oriented to person, place, and time.  Psychiatric:        Mood and Affect: Mood normal.        Behavior:  Behavior normal.     (all labs ordered are listed, but only abnormal results are displayed) Labs Reviewed  RESP PANEL BY RT-PCR (RSV, FLU A&B, COVID)  [id number]  CBC WITH DIFFERENTIAL/PLATELET  COMPREHENSIVE METABOLIC PANEL WITH GFR  URINALYSIS, W/ REFLEX TO CULTURE (INFECTION SUSPECTED)    EKG: EKG Interpretation Date/Time:  Thursday April 01 2024 16:57:14 EST Ventricular Rate:  107 PR Interval:  183 QRS Duration:  108 QT Interval:  335 QTC Calculation: 447 R Axis:   -15  Text Interpretation: Sinus tachycardia Probable left atrial enlargement Borderline left axis deviation Abnormal R-wave progression, early transition Borderline repolarization abnormality No previous ECGs available Confirmed by Patt Alm DEL [phone]) on 04/01/2024 5:02:49 PM  Radiology: No results found.   Procedures   CRITICAL CARE Performed by: Alm DEL Patt   Total critical care time: 37 minutes  Critical care  time was exclusive of separately billable procedures and treating other patients.  Critical care was necessary to treat or prevent imminent or life-threatening deterioration.  Critical care was time spent personally by me on the following activities: development of treatment plan with patient and/or surrogate as well as nursing, discussions with consultants, evaluation of patient's response to treatment, examination of patient, obtaining history from patient or surrogate, ordering and performing treatments and interventions, ordering and review of laboratory studies, ordering and review of radiographic studies, pulse oximetry and re-evaluation of patient's condition.   Medications Ordered in the ED  sodium chloride  0.9 % bolus 1,000 mL (has no administration in time range)                                    Medical Decision Making Bruce Dixon is a 89 y.o. male here presenting with weakness and fall.  Patient is tachycardic and consider flu syndrome versus UTI versus pneumonia.  Plan to check labs and respiratory panel and UA and chest x-ray and CT head.  Will hydrate and reassess.  7:29 PM Patient white count is 30,000 and UA showed nitrates and leukocytes and many bacteria.  Lactate is normal and blood cultures and urine culture were sent off.  CT head and chest x-ray unremarkable.  Concern that patient may be bacteremic from UTI.  Patient was given cefepime .  Hospitalist to admit.  Problems Addressed: Acute cystitis without hematuria: acute illness or injury Bacteremia: acute illness or injury  Amount and/or Complexity of Data Reviewed Labs: ordered. Decision-making details documented in ED Course. Radiology: ordered and independent interpretation performed. Decision-making details documented in ED Course.  Risk OTC drugs.    Final diagnoses:  None    ED Discharge Orders     None          Patt Alm Macho, MD 04/01/24 1931  "

## 2024-04-01 NOTE — H&P (Signed)
 " History and Physical    Bruce Dixon FMW:[id number] DOB: 1932-01-09 DOA: 04/01/2024  PCP: Darlean Ozell NOVAK, MD   Chief Complaint:  weakness  HPI: Bruce Dixon is a 89 y.o. male with medical history significant of hypertension, hyperlipidemia who presented to the emergency department with weakness.  Patient lives at assisted living has had a worsening weakness over the last several days.  He reportedly fell to the ground and was unable to walk.  He was noted to have an abrasion on his forearm and was brought to the ER for further assessment.  On arrival he was afebrile and hemodynamically stable.  Labs were obtained which showed WBC 30.3, hemoglobin 11.8, creatinine 1.3, bicarb 19, urinalysis concerning for infection.  Patient had chest x-ray which showed no acute findings.  X-ray of forearm showed no evidence of fracture.  CT head showed no acute findings.  Patient was started on cefepime  and admitted for further workup.  On admission he was resting, denying complaints.   Review of Systems: Review of Systems  Constitutional:  Positive for malaise/fatigue. Negative for chills and fever.  HENT: Negative.    Eyes: Negative.   Respiratory: Negative.    Cardiovascular: Negative.   Gastrointestinal: Negative.   Genitourinary: Negative.   Musculoskeletal: Negative.   Skin: Negative.   Neurological: Negative.   Endo/Heme/Allergies: Negative.      As per HPI otherwise 10 point review of systems negative.   Allergies[1]  Past Medical History:  Diagnosis Date   Colonic polyp    Hyperlipidemia    Hypertension    Scoliosis     Past Surgical History:  Procedure Laterality Date   APPENDECTOMY     CATARACT EXTRACTION Bilateral    HIATAL HERNIA REPAIR     as a child   INGUINAL HERNIA REPAIR Bilateral 1970     reports that he has never smoked. He has never used smokeless tobacco. He reports current alcohol use. He reports that he does not use drugs.  Family History  Problem  Relation Age of Onset   Parkinsonism Father     Prior to Admission medications  Medication Sig Start Date End Date Taking? Authorizing Provider  acetaminophen  (TYLENOL ) 500 MG tablet Take 500 mg by mouth at bedtime.   Yes [provider]  amLODipine  (NORVASC ) 10 MG tablet Take 10 mg by mouth at bedtime. 09/05/21  Yes [provider]  aspirin  81 MG tablet Take 81 mg by mouth daily.    Yes [provider]  atorvastatin  (LIPITOR) 10 MG tablet 1/2 tablet daily Patient taking differently: Take 10 mg by mouth every evening. 02/19/16  Yes Darlean Ozell NOVAK, MD  levothyroxine  (SYNTHROID ) 25 MCG tablet TAKE ONE (1) TABLET BY MOUTH EACH DAY 12/11/21  Yes [provider]  valsartan  (DIOVAN ) 160 MG tablet Take 160 mg by mouth every evening. 06/04/21  Yes [provider]  vitamin B-12 (CYANOCOBALAMIN ) 100 MCG tablet Take 100 mcg by mouth daily.   Yes [provider]  amLODipine -valsartan  (EXFORGE ) 5-160 MG tablet TAKE ONE (1) TABLET BY MOUTH EVERY DAY Patient not taking: Reported on 04/01/2024 04/16/16   Darlean Ozell NOVAK, MD    Physical Exam: Vitals:   04/01/24 1700 04/01/24 1715 04/01/24 1800 04/01/24 1830  BP: (!) 164/68 (!) 158/60 (!) 142/60 137/60  Pulse: (!) 119 (!) 113 (!) 106 97  Resp: 14 20 (!) 22 19  Temp:      SpO2: 96% 95% 93% 94%  Weight:  Height:       Physical Exam Constitutional:      Appearance: He is normal weight.  HENT:     Head: Normocephalic.     Nose: Nose normal.     Mouth/Throat:     Mouth: Mucous membranes are moist.     Pharynx: Oropharynx is clear.  Eyes:     Extraocular Movements: Extraocular movements intact.     Conjunctiva/sclera: Conjunctivae normal.     Pupils: Pupils are equal, round, and reactive to light.  Cardiovascular:     Rate and Rhythm: Normal rate and regular rhythm.     Pulses: Normal pulses.     Heart sounds: Normal heart sounds.  Pulmonary:     Effort: Pulmonary effort is normal.      Breath sounds: Normal breath sounds.  Abdominal:     General: Abdomen is flat. Bowel sounds are normal.  Musculoskeletal:        General: Normal range of motion.  Skin:    General: Skin is warm.     Capillary Refill: Capillary refill takes less than 2 seconds.  Neurological:     General: No focal deficit present.     Mental Status: He is alert. Mental status is at baseline.  Psychiatric:        Mood and Affect: Mood normal.        Labs on Admission: I have personally reviewed the patients's labs and imaging studies.  Assessment/Plan Principal Problem:   Sepsis (HCC)   # Weakness and fatigue most likely secondary to urinary tract infection - Patient found to have nitrites and leukocytes on urinalysis - Started on cefepime   Plan: Continue ceftriaxone   # Hypertension-continue amlodipine , irbesartan   # Hypothyroidism-continue Synthroid   # Hyperlipidemia-continue Lipitor  # CAD-continue aspirin    Admission status: Inpatient Progressive  Certification: The appropriate patient status for this patient is INPATIENT. Inpatient status is judged to be reasonable and necessary in order to provide the required intensity of service to ensure the patient's safety. The patient's presenting symptoms, physical exam findings, and initial radiographic and laboratory data in the context of their chronic comorbidities is felt to place them at high risk for further clinical deterioration. Furthermore, it is not anticipated that the patient will be medically stable for discharge from the hospital within 2 midnights of admission.   * I certify that at the point of admission it is my clinical judgment that the patient will require inpatient hospital care spanning beyond 2 midnights from the point of admission due to high intensity of service, high risk for further deterioration and high frequency of surveillance required.DEWAINE Lamar Dess MD [facility] If 7PM-7AM, please contact  night-coverage [email or web address]  04/01/2024, 7:48 PM         [1] No Known Allergies  "

## 2024-04-01 NOTE — Progress Notes (Signed)
 ED Pharmacy Antibiotic Sign Off An antibiotic consult was received from an ED provider for cefepime  per pharmacy dosing for bacteremia. A chart review was completed to assess appropriateness.   The following one time order(s) were placed:  - Cefepime  2gm IV x1  Further antibiotic and/or antibiotic pharmacy consults should be ordered by the admitting provider if indicated.   Thank you for allowing pharmacy to be a part of this patient's care.   Osie Iantha SQUIBB, [facility]  Clinical Pharmacist 04/01/2024 6:33 PM

## 2024-04-01 NOTE — ED Triage Notes (Signed)
 pER ems:   Fall (unwitnessed) Due to weakness Ongoing last 3 days

## 2024-04-02 DIAGNOSIS — A419 Sepsis, unspecified organism: Secondary | ICD-10-CM | POA: Diagnosis not present

## 2024-04-02 LAB — CBC
HCT: 35.8 % — ABNORMAL LOW (ref 39.0–52.0)
Hemoglobin: 11.9 g/dL — ABNORMAL LOW (ref 13.0–17.0)
MCH: 30.6 pg (ref 26.0–34.0)
MCHC: 33.2 g/dL (ref 30.0–36.0)
MCV: 92 fL (ref 80.0–100.0)
Platelets: 189 K/uL (ref 150–400)
RBC: 3.89 MIL/uL — ABNORMAL LOW (ref 4.22–5.81)
RDW: 14.2 % (ref 11.5–15.5)
WBC: 28.5 K/uL — ABNORMAL HIGH (ref 4.0–10.5)
nRBC: 0 % (ref 0.0–0.2)

## 2024-04-02 LAB — BASIC METABOLIC PANEL WITH GFR
Anion gap: 13 (ref 5–15)
BUN: 20 mg/dL (ref 8–23)
CO2: 21 mmol/L — ABNORMAL LOW (ref 22–32)
Calcium: 9.1 mg/dL (ref 8.9–10.3)
Chloride: 109 mmol/L (ref 98–111)
Creatinine, Ser: 1.03 mg/dL (ref 0.61–1.24)
GFR, Estimated: >60 mL/min
Glucose, Bld: 125 mg/dL — ABNORMAL HIGH (ref 70–99)
Potassium: 3.9 mmol/L (ref 3.5–5.1)
Sodium: 142 mmol/L (ref 135–145)

## 2024-04-02 MED ORDER — VITAMIN B-12 100 MCG PO TABS
100.0000 ug | ORAL_TABLET | Freq: Every day | ORAL | Status: DC
  Administered 2024-04-02 – 2024-04-05 (×4): 100 ug via ORAL
  Filled 2024-04-02 (×4): qty 1

## 2024-04-02 NOTE — Plan of Care (Signed)
   Problem: Activity: Goal: Risk for activity intolerance will decrease Outcome: Progressing   Problem: Safety: Goal: Ability to remain free from injury will improve Outcome: Progressing   Problem: Skin Integrity: Goal: Risk for impaired skin integrity will decrease Outcome: Progressing

## 2024-04-02 NOTE — Progress Notes (Signed)
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  "

## 2024-04-02 NOTE — Progress Notes (Signed)
 " PROGRESS NOTE    Bruce Dixon  FMW:[id number] DOB: 1931/04/13 DOA: 04/01/2024 PCP: Darlean Ozell NOVAK, MD    Brief Narrative:   Bruce Dixon is a 89 y.o. male with past medical history significant for HTN, HLD, hypothyroidism, vitamin B12 deficiency, osteoarthritis who presented to [facility] ED on 04/01/2024 with progressive weakness resulting in a fall at home.  Currently resident of [facility].  Patient reports that after having lunch he slid down and fell towards the floor, denies loss of consciousness or head injury.  Reports it took him about 2 hours to crawl to the call cord.  Denies any sick contacts.  No recent changes in the medication.  Reports that he is fairly active, used to play tennis and now goes for 1-2 long walks daily.  In the ED, temperature 98.6 F, HR 119, RR 18, BP 138/67, SpO2 95% on room air.  WBC 30.3, hemoglobin 11.8, platelet count 200.  Sodium 135, potassium 4.2, chloride 104, CO2 19, glucose 156, BUN 24, creat 1.31.  AST 26, ALT 10, total bilirubin 0.5.  COVID/influenza/RSV PCR negative.  Urinalysis with small leukocytes, positive nitrite, many bacteria, 11-20 WBCs.  Chest x-ray with no active cardiopulmonary disease process.  Left forearm x-ray with no fracture, but question mild soft tissue edema distally.  Blood cultures x 2 and urine culture obtained.  Patient was started on empiric antibiotics.  TRH consulted for admission for further evaluation and management of sepsis secondary to UTI.  Assessment & Plan:   Sepsis, POA Urinary tract infection Patient presenting with 3-day history of progressive weakness resulting in fall without loss of consciousness.  Patient endorses increased urination.  Patient was afebrile, tachycardic with elevated WBC count of 30.3 on admission.  Lactic acid 1.1.  Urinalysis consistent with urinary tract infection. -- Blood cultures x 2: Pending -- Urine culture: Pending -- Ceftriaxone  2 g IV every 24 hours --  Await urine culture results for identification/susceptibilities -- Supportive care  HTN -- Irbesartan  150 mg p.o. daily (substituted for home valsartan  160 mg p.o. daily) -- Amlodipine  10 mg p.o. daily --Continue aspirin  and statin  HLD -- Atorvastatin  10 mg p.o. daily   Hypothyroidism -- Levothyroxine  25 mcg p.o. daily  B12 deficiency -- Cyanocobalamin  100 mcg p.o. daily  Weakness/debility/deconditioning: -- PT/OT evaluation -- From [facility], TOC consultation   DVT prophylaxis: enoxaparin  (LOVENOX ) injection 40 mg Start: 04/01/24 2200 SCDs Start: 04/01/24 1945    Code Status: Full Code Family Communication: No family present at bedside  Disposition Plan:  Level of care: Telemetry Status is: Inpatient Remains inpatient appropriate because: IV antibiotics, PT/OT evaluation    Consultants:  None  Procedures:  None  Antimicrobials:  Cefepime  12/25 - 12/25 Ceftriaxone  12/25>>   Subjective: Patient seen examined bedside, lying in bed.  Remains in ED holding area.  No specific complaints at this time.  Generalized weakness improved.  Discussed treatment plan, IV antibiotics.  Awaiting urine/blood culture results.  No other questions or concerns at this time.  Denies headache, no dizziness, no chest pain, no palpitations, no shortness of breath, no abdominal pain, no fever/chills/night sweats, no nausea/vomiting/diarrhea, no focal weakness, no fatigue, no paresthesias.  No acute events overnight per nursing staff.  Objective: Vitals:   04/02/24 0430 04/02/24 0500 04/02/24 0640 04/02/24 1358  BP: (!) 133/106 (!) 132/56 135/67 (!) 148/61  Pulse: 90 85  (!) 106  Resp: 18 15 14    Temp:    98.3 F (  36.8 C)  TempSrc:      SpO2: 97% 97%  96%  Weight:      Height:        Intake/Output Summary (Last 24 hours) at 04/02/2024 1412 Last data filed at 04/02/2024 0542 Gross per 24 hour  Intake 1201.46 ml  Output --  Net 1201.46 ml   Filed Weights   04/01/24  1655  Weight: 74.4 kg    Examination:  Physical Exam: GEN: NAD, alert and oriented x 3, wd/wn HEENT: NCAT, PERRL, EOMI, sclera clear, MMM PULM: CTAB w/o wheezes/crackles, normal respiratory effort CV: RRR w/o M/G/R GI: abd soft, NTND, NABS, no R/G/M MSK: no peripheral edema, muscle strength globally intact 5/5 bilateral upper/lower extremities NEURO: CN II-XII intact, no focal deficits, sensation to light touch intact PSYCH: normal mood/affect Integumentary: dry/intact, no rashes or wounds    Data Reviewed: I have personally reviewed following labs and imaging studies  CBC: Recent Labs  Lab 04/01/24 1736 04/01/24 2103 04/02/24 0500  WBC 30.3* 27.9* 28.5*  NEUTROABS 26.7*  --   --   HGB 11.8* 11.0* 11.9*  HCT 34.7* 32.7* 35.8*  MCV 90.6 91.1 92.0  PLT 200 178 189   Basic Metabolic Panel: Recent Labs  Lab 04/01/24 1736 04/01/24 2103 04/02/24 0500  NA 135  --  142  K 4.2  --  3.9  CL 104  --  109  CO2 19*  --  21*  GLUCOSE 156*  --  125*  BUN 24*  --  20  CREATININE 1.31* 1.20 1.03  CALCIUM  9.4  --  9.1   GFR: Estimated Creatinine Clearance: 43.1 mL/min (by C-G formula based on SCr of 1.03 mg/dL). Liver Function Tests: Recent Labs  Lab 04/01/24 1736  AST 26  ALT 10  ALKPHOS 73  BILITOT 0.5  PROT 7.1  ALBUMIN 4.3   No results for input(s): LIPASE, AMYLASE in the last 168 hours. No results for input(s): AMMONIA in the last 168 hours. Coagulation Profile: No results for input(s): INR, PROTIME in the last 168 hours. Cardiac Enzymes: No results for input(s): CKTOTAL, CKMB, CKMBINDEX, TROPONINI in the last 168 hours. BNP (last 3 results) No results for input(s): PROBNP in the last 8760 hours. HbA1C: No results for input(s): HGBA1C in the last 72 hours. CBG: No results for input(s): GLUCAP in the last 168 hours. Lipid Profile: No results for input(s): CHOL, HDL, LDLCALC, TRIG, CHOLHDL, LDLDIRECT in the last 72  hours. Thyroid  Function Tests: No results for input(s): TSH, T4TOTAL, FREET4, T3FREE, THYROIDAB in the last 72 hours. Anemia Panel: No results for input(s): VITAMINB12, FOLATE, FERRITIN, TIBC, IRON, RETICCTPCT in the last 72 hours. Sepsis Labs: Recent Labs  Lab 04/01/24 1836  LATICACIDVEN 1.1    Recent Results (from the past 240 hours)  Resp panel by RT-PCR (RSV, Flu A&B, Covid) Anterior Nasal Swab     Status: None   Collection Time: 04/01/24  5:35 PM   Specimen: Anterior Nasal Swab  Result Value Ref Range Status   SARS Coronavirus 2 by RT PCR NEGATIVE NEGATIVE Final    Comment: (NOTE) SARS-CoV-2 target nucleic acids are NOT DETECTED.  The SARS-CoV-2 RNA is generally detectable in upper respiratory specimens during the acute phase of infection. The lowest concentration of SARS-CoV-2 viral copies this assay can detect is 138 copies/mL. A negative result does not preclude SARS-Cov-2 infection and should not be used as the sole basis for treatment or other patient management decisions. A negative result may occur with  improper specimen collection/handling, submission of specimen other than nasopharyngeal swab, presence of viral mutation(s) within the areas targeted by this assay, and inadequate number of viral copies(<138 copies/mL). A negative result must be combined with clinical observations, patient history, and epidemiological information. The expected result is Negative.  Fact Sheet for Patients:  bloggercourse.com  Fact Sheet for Healthcare Providers:  seriousbroker.it  This test is no t yet approved or cleared by the [place]  FDA and  has been authorized for detection and/or diagnosis of SARS-CoV-2 by FDA under an Emergency Use Authorization (EUA). This EUA will remain  in effect (meaning this test can be used) for the duration of the COVID-19 declaration under Section 564(b)(1) of the Act,  21 U.S.C.section 360bbb-3(b)(1), unless the authorization is terminated  or revoked sooner.       Influenza A by PCR NEGATIVE NEGATIVE Final   Influenza B by PCR NEGATIVE NEGATIVE Final    Comment: (NOTE) The Xpert Xpress SARS-CoV-2/FLU/RSV plus assay is intended as an aid in the diagnosis of influenza from Nasopharyngeal swab specimens and should not be used as a sole basis for treatment. Nasal washings and aspirates are unacceptable for Xpert Xpress SARS-CoV-2/FLU/RSV testing.  Fact Sheet for Patients: bloggercourse.com  Fact Sheet for Healthcare Providers: seriousbroker.it  This test is not yet approved or cleared by the [place]  FDA and has been authorized for detection and/or diagnosis of SARS-CoV-2 by FDA under an Emergency Use Authorization (EUA). This EUA will remain in effect (meaning this test can be used) for the duration of the COVID-19 declaration under Section 564(b)(1) of the Act, 21 U.S.C. section 360bbb-3(b)(1), unless the authorization is terminated or revoked.     Resp Syncytial Virus by PCR NEGATIVE NEGATIVE Final    Comment: (NOTE) Fact Sheet for Patients: bloggercourse.com  Fact Sheet for Healthcare Providers: seriousbroker.it  This test is not yet approved or cleared by the [place]  FDA and has been authorized for detection and/or diagnosis of SARS-CoV-2 by FDA under an Emergency Use Authorization (EUA). This EUA will remain in effect (meaning this test can be used) for the duration of the COVID-19 declaration under Section 564(b)(1) of the Act, 21 U.S.C. section 360bbb-3(b)(1), unless the authorization is terminated or revoked.  Performed at [facility], [address] [place], [place] [address]   Urine Culture     Status: None (Preliminary result)   Collection Time: 04/01/24  5:37 PM   Specimen: Urine, Clean Catch   Result Value Ref Range Status   Specimen Description   Final    URINE, CLEAN CATCH Performed at [facility], [address], [place], [place] [address]    Special Requests   Final    NONE Reflexed from [id number] Performed at [facility], [address] [place], [place] [address]    Culture PENDING  Incomplete   Report Status PENDING  Incomplete  Blood culture (routine x 2)     Status: None (Preliminary result)   Collection Time: 04/01/24  6:14 PM   Specimen: BLOOD RIGHT ARM  Result Value Ref Range Status   Specimen Description   Final    BLOOD RIGHT ARM Performed at [facility], [address], [place], [place] [address]    Special Requests   Final    BOTTLES DRAWN AEROBIC AND ANAEROBIC Blood Culture adequate volume Performed at [facility], [address], [place], [place] [address]    Culture   Final  NO GROWTH < 12 HOURS Performed at [facility], [address], [place], [place] [address]    Report Status PENDING  Incomplete  Blood culture (routine x 2)     Status: None (Preliminary result)   Collection Time: 04/01/24  6:28 PM   Specimen: BLOOD RIGHT HAND  Result Value Ref Range Status   Specimen Description   Final    BLOOD RIGHT HAND Performed at [facility], [address] [place], [place] [address]    Special Requests   Final    BOTTLES DRAWN AEROBIC AND ANAEROBIC Blood Culture adequate volume Performed at [facility] [address], [place], [place] [address]    Culture   Final    NO GROWTH < 12 HOURS Performed at [facility], [address] [place], [place] [address]    Report Status PENDING  Incomplete         Radiology Studies: CT HEAD WO CONTRAST ( ) Result Date: 04/01/2024 EXAM: CT HEAD WITHOUT CONTRAST 04/01/2024 05:50:21 PM TECHNIQUE: CT of the head was performed without the administration of intravenous contrast. Automated exposure  control, iterative reconstruction, and/or weight based adjustment of the mA/kV was utilized to reduce the radiation dose to as low as reasonably achievable. COMPARISON: None available. CLINICAL HISTORY: Mental status change, unknown cause FINDINGS: BRAIN AND VENTRICLES: No acute hemorrhage. No evidence of acute infarct. No hydrocephalus. No extra-axial collection. No mass effect or midline shift. ORBITS: No acute abnormality. SINUSES: No acute abnormality. SOFT TISSUES AND SKULL: No acute soft tissue abnormality. No skull fracture. IMPRESSION: 1. No acute intracranial abnormality. Electronically signed by: Gilmore Molt 04/01/2024 06:04 PM EST RP Workstation: [id number]   DG Forearm Left Result Date: 04/01/2024 CLINICAL DATA:  Unwitnessed fall. EXAM: LEFT FOREARM - 2 VIEW COMPARISON:  None Available. FINDINGS: There is no evidence of fracture or other focal bone lesions. Wrist and elbow alignment are maintained. Question mild soft tissue edema distally. IMPRESSION: No fracture of the left forearm. Question mild soft tissue edema distally. Electronically Signed   By: Andrea Gasman M.D.   On: 04/01/2024 17:40   DG Chest 1 View Result Date: 04/01/2024 CLINICAL DATA:  Unwitnessed fall.  Weakness. EXAM: CHEST  1 VIEW COMPARISON:  02/09/2016 FINDINGS: The cardiomediastinal contours are stable. The lungs are clear. Pulmonary vasculature is normal. No consolidation, pleural effusion, or pneumothorax. Chronic left upper rib deformities. Scoliotic curvature of the spine. No acute osseous abnormalities are seen. IMPRESSION: No active disease. Electronically Signed   By: Andrea Gasman M.D.   On: 04/01/2024 17:39        Scheduled Meds:  acetaminophen   500 mg Oral QHS   amLODipine   10 mg Oral QHS   aspirin  EC  81 mg Oral Daily   atorvastatin   10 mg Oral QPM   enoxaparin  (LOVENOX ) injection  40 mg Subcutaneous Q24H   irbesartan   150 mg Oral Daily   levothyroxine   25 mcg Oral Q0600   Continuous  Infusions:  cefTRIAXone  (ROCEPHIN )  IV Stopped (04/02/24 0542)     LOS: 1 day    Time spent: 52 minutes spent on 04/02/2024 caring for this patient face-to-face including chart review, ordering labs/tests, documenting, discussion with nursing staff, consultants, updating family and interview/physical exam    Bruce PARAS Idalys Konecny, DO [facility] Available via Epic secure chat 7am-7pm After these hours, please refer to coverage provider listed on [email or web address] 04/02/2024, 2:12 PM   "

## 2024-04-03 LAB — URINE CULTURE: Culture: >=100000 — AB

## 2024-04-03 LAB — BASIC METABOLIC PANEL WITH GFR
Anion gap: 15 (ref 5–15)
BUN: 27 mg/dL — ABNORMAL HIGH (ref 8–23)
CO2: 19 mmol/L — ABNORMAL LOW (ref 22–32)
Calcium: 9 mg/dL (ref 8.9–10.3)
Chloride: 106 mmol/L (ref 98–111)
Creatinine, Ser: 1.07 mg/dL (ref 0.61–1.24)
GFR, Estimated: >60 mL/min
Glucose, Bld: 116 mg/dL — ABNORMAL HIGH (ref 70–99)
Potassium: 4.1 mmol/L (ref 3.5–5.1)
Sodium: 141 mmol/L (ref 135–145)

## 2024-04-03 LAB — CBC
HCT: 33.9 % — ABNORMAL LOW (ref 39.0–52.0)
Hemoglobin: 11.6 g/dL — ABNORMAL LOW (ref 13.0–17.0)
MCH: 31 pg (ref 26.0–34.0)
MCHC: 34.2 g/dL (ref 30.0–36.0)
MCV: 90.6 fL (ref 80.0–100.0)
Platelets: 195 K/uL (ref 150–400)
RBC: 3.74 MIL/uL — ABNORMAL LOW (ref 4.22–5.81)
RDW: 13.9 % (ref 11.5–15.5)
WBC: 22.5 K/uL — ABNORMAL HIGH (ref 4.0–10.5)
nRBC: 0 % (ref 0.0–0.2)

## 2024-04-03 NOTE — Evaluation (Signed)
 Physical Therapy Evaluation Patient Details Name: Bruce Dixon MRN: [id number] DOB: 1932-02-25 Today's Date: 04/03/2024  History of Present Illness  DARRELL HAUK is a 89 y.o. male  who presented to ED on 04/01/2024 with progressive weakness resulting in a fall at home, admitted for  sepsis secondary to UTI. PMH: HTN, HLD, hypothyroidism, vitamin B12 deficiency, osteoarthritis  Clinical Impression  Pt admitted with above diagnosis.  Pt currently with functional limitations due to the deficits listed below (see PT Problem List). Pt will benefit from acute skilled PT to increase their independence and safety with mobility to allow discharge.     The patient reports being very independent, driving, jogs on the sidewalks/road at [facility].  Patient  BP  143/58 sup/ HR 101, 133/70/ HR 110, sitting,  144/74 post ambulation/HR121, SPo2 96% The patient ambulated with a RW  with supervision x 150' then   ~ 71' with no device and CGA. Hopefully patient will recover to return to his ILF with  continued PT,if he does demonstrate concern for bring alone then may benefit from  short Rehab at [facility].       If plan is discharge home, recommend the following: A little help with walking and/or transfers;A little help with bathing/dressing/bathroom;Help with stairs or ramp for entrance;Assist for transportation   Can travel by private vehicle   Yes    Equipment Recommendations Rollator (4 wheels) (may progress and no need)  Recommendations for Other Services       Functional Status Assessment Patient has had a recent decline in their functional status and demonstrates the ability to make significant improvements in function in a reasonable and predictable amount of time.     Precautions / Restrictions Precautions Precautions: Fall Precaution/Restrictions Comments: pt reports that  his legs just gave way. Restrictions Weight Bearing Restrictions Per Provider Order: No      Mobility   Bed Mobility Overal bed mobility: Needs Assistance Bed Mobility: Rolling, Sidelying to Sit Rolling: Supervision Sidelying to sit: Min assist       General bed mobility comments: a little support to sit up    Transfers Overall transfer level: Needs assistance Equipment used: Rolling walker (2 wheels) Transfers: Sit to/from Stand Sit to Stand: Contact guard assist           General transfer comment: cues for hand support    Ambulation/Gait Ambulation/Gait assistance: supervision Gait Distance (Feet): 150 Feet Assistive device: Rolling walker (2 wheels) Gait Pattern/deviations: Step-through pattern Gait velocity: decr     General Gait Details: then ambualted x 50' with no device and noted to be unsteady requiring CGA  Stairs            Wheelchair Mobility     Tilt Bed    Modified Rankin (Stroke Patients Only)       Balance Overall balance assessment: History of Falls, Needs assistance   Sitting balance-Leahy Scale: Normal     Standing balance support: During functional activity, No upper extremity supported Standing balance-Leahy Scale: Poor                               Pertinent Vitals/Pain Pain Assessment Pain Assessment: Faces Faces Pain Scale: Hurts little more Pain Location: right elbow Pain Descriptors / Indicators: Discomfort Pain Intervention(s): Monitored during session    Home Living Family/patient expects to be discharged to:: Private residence Living Arrangements: Alone   Type of Home: Independent living facility  Home Access: Level entry;Elevator       Home Layout: One level Home Equipment: None Additional Comments: no famil;y , has [facility] [place] support    Prior Function Prior Level of Function : Independent/Modified Independent;Driving             Mobility Comments: reports that he walks and jogs,       Extremity/Trunk Assessment   Upper Extremity Assessment Upper Extremity Assessment: Overall WFL  for tasks assessed    Lower Extremity Assessment Lower Extremity Assessment: Overall WFL for tasks assessed    Cervical / Trunk Assessment Cervical / Trunk Assessment: Normal  Communication   Communication Communication: No apparent difficulties    Cognition Arousal: Alert Behavior During Therapy: WFL for tasks assessed/performed   PT - Cognitive impairments: No apparent impairments                         Following commands: Intact       Cueing       General Comments      Exercises     Assessment/Plan    PT Assessment Patient needs continued PT services  PT Problem List Decreased strength;Decreased activity tolerance;Decreased mobility       PT Treatment Interventions DME instruction;Gait training;Functional mobility training;Therapeutic activities;Therapeutic exercise;Balance training;Patient/family education    PT Goals (Current goals can be found in the Care Plan section)  Acute Rehab PT Goals Patient Stated Goal: go home PT Goal Formulation: With patient Time For Goal Achievement: 04/17/24 Potential to Achieve Goals: Good    Frequency Min 3X/week     Co-evaluation               AM-PAC PT 6 Clicks Mobility  Outcome Measure Help needed turning from your back to your side while in a flat bed without using bedrails?: None Help needed moving from lying on your back to sitting on the side of a flat bed without using bedrails?: A Little Help needed moving to and from a bed to a chair (including a wheelchair)?: A Little Help needed standing up from a chair using your arms (e.g., wheelchair or bedside chair)?: A Little Help needed to walk in hospital room?: A Little Help needed climbing 3-5 steps with a railing? : A Lot 6 Click Score: 18    End of Session Equipment Utilized During Treatment: Gait belt Activity Tolerance: Patient tolerated treatment well Patient left: in chair;with call bell/phone within reach Nurse Communication:  Mobility status (no chair alarm in  room) PT Visit Diagnosis: Unsteadiness on feet (R26.81)    Time: 9054-8985 PT Time Calculation (min) (ACUTE ONLY): 29 min   Charges:   PT Evaluation $PT Eval Low Complexity: 1 Low PT Treatments $Gait Training: 8-22 mins PT General Charges $$ ACUTE PT VISIT: 1 Visit         Darice Potters PT Acute Rehabilitation Services Office [phone]   Potters Darice Norris 04/03/2024, 1:12 PM

## 2024-04-03 NOTE — Plan of Care (Signed)

## 2024-04-03 NOTE — Progress Notes (Signed)
 Mobility Specialist Progress Note:   04/03/24 1452  Mobility  Activity Ambulated independently;Ambulated with assistance  Level of Assistance Minimal assist, patient does 75% or more  Assistive Device None;Front wheel walker  Distance Ambulated (ft) 170 ft  Activity Response Tolerated fair  Mobility Referral Yes  Mobility visit 1 Mobility  Mobility Specialist Start Time (ACUTE ONLY) 1351  Mobility Specialist Stop Time (ACUTE ONLY) 1402  Mobility Specialist Time Calculation (min) (ACUTE ONLY) 11 min   Pt was received in recliner and eager to mobility. Ambulated 60 ft w/ no device, 1x bout of unsteadiness but recovered quickly. Ambulated an additional 110 ft w/ RW for safety. Returned to recliner with all needs met. Call bell in reach.  [employer] - Mobility Specialist

## 2024-04-03 NOTE — Progress Notes (Addendum)
 " PROGRESS NOTE    Bruce Dixon  FMW:[id number] DOB: 1932-01-19 DOA: 04/01/2024 PCP: Darlean Ozell NOVAK, MD    Brief Narrative:   Bruce Dixon is a 89 y.o. male with past medical history significant for HTN, HLD, hypothyroidism, vitamin B12 deficiency, osteoarthritis who presented to [facility] ED on 04/01/2024 with progressive weakness resulting in a fall at home.  Currently resident of [facility].  Patient reports that after having lunch he slid down and fell towards the floor, denies loss of consciousness or head injury.  Reports it took him about 2 hours to crawl to the call cord.  Denies any sick contacts.  No recent changes in the medication.  Reports that he is fairly active, used to play tennis and now goes for 1-2 long walks daily.  In the ED, temperature 98.6 F, HR 119, RR 18, BP 138/67, SpO2 95% on room air.  WBC 30.3, hemoglobin 11.8, platelet count 200.  Sodium 135, potassium 4.2, chloride 104, CO2 19, glucose 156, BUN 24, creat 1.31.  AST 26, ALT 10, total bilirubin 0.5.  COVID/influenza/RSV PCR negative.  Urinalysis with small leukocytes, positive nitrite, many bacteria, 11-20 WBCs.  Chest x-ray with no active cardiopulmonary disease process.  Left forearm x-ray with no fracture, but question mild soft tissue edema distally.  Blood cultures x 2 and urine culture obtained.  Patient was started on empiric antibiotics.  TRH consulted for admission for further evaluation and management of sepsis secondary to UTI.  Assessment & Plan:   Sepsis, POA Klebsiella aerogenes urinary tract infection Patient presenting with 3-day history of progressive weakness resulting in fall without loss of consciousness.  Patient endorses increased urination.  Patient was afebrile, tachycardic with elevated WBC count of 30.3 on admission.  Lactic acid 1.1.  Urinalysis consistent with urinary tract infection. -- WBC 30.3>27.9>28.5>22.5 -- Blood cultures x 2: no growth x 2 days -- Urine  culture: + Klebsiella aerogenes -- Ceftriaxone  2 g IV every 24 hours -- Supportive care  HTN -- Irbesartan  150 mg p.o. daily (substituted for home valsartan  160 mg p.o. daily) -- Amlodipine  10 mg p.o. daily -- Continue aspirin  and statin  HLD -- Atorvastatin  10 mg p.o. daily   Hypothyroidism -- Levothyroxine  25 mcg p.o. daily  B12 deficiency -- Cyanocobalamin  100 mcg p.o. daily  Weakness/debility/deconditioning: -- PT recommending SNF placement -- OT evaluation: pending -- [facility] [place] [facility], TOC consulted   DVT prophylaxis: enoxaparin  (LOVENOX ) injection 40 mg Start: 04/01/24 2200 SCDs Start: 04/01/24 1945    Code Status: Full Code Family Communication: No family present at bedside  Disposition Plan:  Level of care: Telemetry Status is: Inpatient Remains inpatient appropriate because: IV antibiotics, PT/OT evaluation    Consultants:  None  Procedures:  None  Antimicrobials:  Cefepime  12/25 - 12/25 Ceftriaxone  12/25>>   Subjective: Patient seen examined bedside, lying in bed.  Complaining of left shoulder pain related to his known osteoarthritis.  Remains weak.  Awaiting PT/OT evaluation.  Urine culture positive for Klebsiella.  Remains on IV ceftriaxone . No other questions or concerns at this time.  Denies headache, no dizziness, no chest pain, no palpitations, no shortness of breath, no abdominal pain, no fever/chills/night sweats, no nausea/vomiting/diarrhea, no focal weakness, no fatigue, no paresthesias.  No acute events overnight per nursing staff.  Objective: Vitals:   04/02/24 1814 04/02/24 2211 04/03/24 0155 04/03/24 1100  BP: (!) 146/62 (!) 145/61 (!) 116/45 126/61  Pulse: 100 92 86   Resp:  20 20   Temp: 99.1 F (37.3 C) 99.1 F (37.3 C) 99.4 F (37.4 C)   TempSrc: Oral Oral Oral   SpO2: 94% 95% 96%   Weight:      Height:        Intake/Output Summary (Last 24 hours) at 04/03/2024 1316 Last data filed at 04/03/2024 9472 Gross per 24  hour  Intake 189.37 ml  Output 375 ml  Net -185.63 ml   Filed Weights   04/01/24 1655  Weight: 74.4 kg    Examination:  Physical Exam: GEN: NAD, alert and oriented x 3, wd/wn HEENT: NCAT, PERRL, EOMI, sclera clear, MMM PULM: CTAB w/o wheezes/crackles, normal respiratory effort CV: RRR w/o M/G/R GI: abd soft, NTND, NABS, no R/G/M MSK: no peripheral edema, muscle strength globally intact 5/5 bilateral upper/lower extremities NEURO: CN II-XII intact, no focal deficits, sensation to light touch intact PSYCH: normal mood/affect Integumentary: dry/intact, no rashes or wounds    Data Reviewed: I have personally reviewed following labs and imaging studies  CBC: Recent Labs  Lab 04/01/24 1736 04/01/24 2103 04/02/24 0500 04/03/24 0604  WBC 30.3* 27.9* 28.5* 22.5*  NEUTROABS 26.7*  --   --   --   HGB 11.8* 11.0* 11.9* 11.6*  HCT 34.7* 32.7* 35.8* 33.9*  MCV 90.6 91.1 92.0 90.6  PLT 200 178 189 195   Basic Metabolic Panel: Recent Labs  Lab 04/01/24 1736 04/01/24 2103 04/02/24 0500 04/03/24 0604  NA 135  --  142 141  K 4.2  --  3.9 4.1  CL 104  --  109 106  CO2 19*  --  21* 19*  GLUCOSE 156*  --  125* 116*  BUN 24*  --  20 27*  CREATININE 1.31* 1.20 1.03 1.07  CALCIUM  9.4  --  9.1 9.0   GFR: Estimated Creatinine Clearance: 41.5 mL/min (by C-G formula based on SCr of 1.07 mg/dL). Liver Function Tests: Recent Labs  Lab 04/01/24 1736  AST 26  ALT 10  ALKPHOS 73  BILITOT 0.5  PROT 7.1  ALBUMIN 4.3   No results for input(s): LIPASE, AMYLASE in the last 168 hours. No results for input(s): AMMONIA in the last 168 hours. Coagulation Profile: No results for input(s): INR, PROTIME in the last 168 hours. Cardiac Enzymes: No results for input(s): CKTOTAL, CKMB, CKMBINDEX, TROPONINI in the last 168 hours. BNP (last 3 results) No results for input(s): PROBNP in the last 8760 hours. HbA1C: No results for input(s): HGBA1C in the last 72  hours. CBG: No results for input(s): GLUCAP in the last 168 hours. Lipid Profile: No results for input(s): CHOL, HDL, LDLCALC, TRIG, CHOLHDL, LDLDIRECT in the last 72 hours. Thyroid  Function Tests: No results for input(s): TSH, T4TOTAL, FREET4, T3FREE, THYROIDAB in the last 72 hours. Anemia Panel: No results for input(s): VITAMINB12, FOLATE, FERRITIN, TIBC, IRON, RETICCTPCT in the last 72 hours. Sepsis Labs: Recent Labs  Lab 04/01/24 1836  LATICACIDVEN 1.1    Recent Results (from the past 240 hours)  Resp panel by RT-PCR (RSV, Flu A&B, Covid) Anterior Nasal Swab     Status: None   Collection Time: 04/01/24  5:35 PM   Specimen: Anterior Nasal Swab  Result Value Ref Range Status   SARS Coronavirus 2 by RT PCR NEGATIVE NEGATIVE Final    Comment: (NOTE) SARS-CoV-2 target nucleic acids are NOT DETECTED.  The SARS-CoV-2 RNA is generally detectable in upper respiratory specimens during the acute phase of infection. The lowest concentration of SARS-CoV-2 viral copies this  assay can detect is 138 copies/mL. A negative result does not preclude SARS-Cov-2 infection and should not be used as the sole basis for treatment or other patient management decisions. A negative result may occur with  improper specimen collection/handling, submission of specimen other than nasopharyngeal swab, presence of viral mutation(s) within the areas targeted by this assay, and inadequate number of viral copies(<138 copies/mL). A negative result must be combined with clinical observations, patient history, and epidemiological information. The expected result is Negative.  Fact Sheet for Patients:  bloggercourse.com  Fact Sheet for Healthcare Providers:  seriousbroker.it  This test is no t yet approved or cleared by the [place]  FDA and  has been authorized for detection and/or diagnosis of SARS-CoV-2 by FDA under  an Emergency Use Authorization (EUA). This EUA will remain  in effect (meaning this test can be used) for the duration of the COVID-19 declaration under Section 564(b)(1) of the Act, 21 U.S.C.section 360bbb-3(b)(1), unless the authorization is terminated  or revoked sooner.       Influenza A by PCR NEGATIVE NEGATIVE Final   Influenza B by PCR NEGATIVE NEGATIVE Final    Comment: (NOTE) The Xpert Xpress SARS-CoV-2/FLU/RSV plus assay is intended as an aid in the diagnosis of influenza from Nasopharyngeal swab specimens and should not be used as a sole basis for treatment. Nasal washings and aspirates are unacceptable for Xpert Xpress SARS-CoV-2/FLU/RSV testing.  Fact Sheet for Patients: bloggercourse.com  Fact Sheet for Healthcare Providers: seriousbroker.it  This test is not yet approved or cleared by the [place]  FDA and has been authorized for detection and/or diagnosis of SARS-CoV-2 by FDA under an Emergency Use Authorization (EUA). This EUA will remain in effect (meaning this test can be used) for the duration of the COVID-19 declaration under Section 564(b)(1) of the Act, 21 U.S.C. section 360bbb-3(b)(1), unless the authorization is terminated or revoked.     Resp Syncytial Virus by PCR NEGATIVE NEGATIVE Final    Comment: (NOTE) Fact Sheet for Patients: bloggercourse.com  Fact Sheet for Healthcare Providers: seriousbroker.it  This test is not yet approved or cleared by the [place]  FDA and has been authorized for detection and/or diagnosis of SARS-CoV-2 by FDA under an Emergency Use Authorization (EUA). This EUA will remain in effect (meaning this test can be used) for the duration of the COVID-19 declaration under Section 564(b)(1) of the Act, 21 U.S.C. section 360bbb-3(b)(1), unless the authorization is terminated or revoked.  Performed at [facility], [address], [place], [place] [address]   Urine Culture     Status: Abnormal   Collection Time: 04/01/24  5:37 PM   Specimen: Urine, Clean Catch  Result Value Ref Range Status   Specimen Description   Final    URINE, CLEAN CATCH Performed at [facility], [address], [place], [place] [address]    Special Requests   Final    NONE Reflexed from [id number] Performed at [facility], [address], [place], [place] [address]    Culture >=100,000 COLONIES/mL KLEBSIELLA AEROGENES (A)  Final   Report Status 04/03/2024 FINAL  Final   Organism ID, Bacteria KLEBSIELLA AEROGENES (A)  Final      Susceptibility   Klebsiella aerogenes - MIC*    CEFEPIME  <=0.12 SENSITIVE Sensitive     ERTAPENEM <=0.12 SENSITIVE Sensitive     CEFTRIAXONE  <=0.25 SENSITIVE Sensitive     CIPROFLOXACIN <=0.06 SENSITIVE Sensitive     GENTAMICIN <=1 SENSITIVE Sensitive  NITROFURANTOIN 128 RESISTANT Resistant     TRIMETH/SULFA <=20 SENSITIVE Sensitive     PIP/TAZO Value in next row Sensitive      <=4 SENSITIVEThis is a modified FDA-approved test that has been validated and its performance characteristics determined by the reporting laboratory.  This laboratory is certified under the Clinical Laboratory Improvement Amendments CLIA as qualified to perform high complexity clinical laboratory testing.    MEROPENEM Value in next row Sensitive      <=4 SENSITIVEThis is a modified FDA-approved test that has been validated and its performance characteristics determined by the reporting laboratory.  This laboratory is certified under the Clinical Laboratory Improvement Amendments CLIA as qualified to perform high complexity clinical laboratory testing.    * >=100,000 COLONIES/mL KLEBSIELLA AEROGENES  Blood culture (routine x 2)     Status: None (Preliminary result)   Collection Time: 04/01/24  6:14 PM   Specimen: BLOOD RIGHT ARM  Result Value Ref Range Status   Specimen  Description   Final    BLOOD RIGHT ARM Performed at [facility], [address] [place], [place] [address]    Special Requests   Final    BOTTLES DRAWN AEROBIC AND ANAEROBIC Blood Culture adequate volume Performed at [facility], [address], [place], [place] [address]    Culture   Final    NO GROWTH 2 DAYS Performed at [facility], [address] [place], [place] [address]    Report Status PENDING  Incomplete  Blood culture (routine x 2)     Status: None (Preliminary result)   Collection Time: 04/01/24  6:28 PM   Specimen: BLOOD RIGHT HAND  Result Value Ref Range Status   Specimen Description   Final    BLOOD RIGHT HAND Performed at [facility], [address], [place], [place] [address]    Special Requests   Final    BOTTLES DRAWN AEROBIC AND ANAEROBIC Blood Culture adequate volume Performed at [facility], [address], [place], [place] [address]    Culture   Final    NO GROWTH 2 DAYS Performed at [facility], [address] [place], [place] [address]    Report Status PENDING  Incomplete         Radiology Studies: CT HEAD WO CONTRAST ( ) Result Date: 04/01/2024 EXAM: CT HEAD WITHOUT CONTRAST 04/01/2024 05:50:21 PM TECHNIQUE: CT of the head was performed without the administration of intravenous contrast. Automated exposure control, iterative reconstruction, and/or weight based adjustment of the mA/kV was utilized to reduce the radiation dose to as low as reasonably achievable. COMPARISON: None available. CLINICAL HISTORY: Mental status change, unknown cause FINDINGS: BRAIN AND VENTRICLES: No acute hemorrhage. No evidence of acute infarct. No hydrocephalus. No extra-axial collection. No mass effect or midline shift. ORBITS: No acute abnormality. SINUSES: No acute abnormality. SOFT TISSUES AND SKULL: No acute soft tissue abnormality. No skull fracture. IMPRESSION: 1. No acute  intracranial abnormality. Electronically signed by: Gilmore Molt 04/01/2024 06:04 PM EST RP Workstation: [id number]   DG Forearm Left Result Date: 04/01/2024 CLINICAL DATA:  Unwitnessed fall. EXAM: LEFT FOREARM - 2 VIEW COMPARISON:  None Available. FINDINGS: There is no evidence of fracture or other focal bone lesions. Wrist and elbow alignment are maintained. Question mild soft tissue edema distally. IMPRESSION: No fracture of the left forearm. Question mild soft tissue edema distally. Electronically Signed   By: Andrea Gasman M.D.   On: 04/01/2024 17:40  DG Chest 1 View Result Date: 04/01/2024 CLINICAL DATA:  Unwitnessed fall.  Weakness. EXAM: CHEST  1 VIEW COMPARISON:  02/09/2016 FINDINGS: The cardiomediastinal contours are stable. The lungs are clear. Pulmonary vasculature is normal. No consolidation, pleural effusion, or pneumothorax. Chronic left upper rib deformities. Scoliotic curvature of the spine. No acute osseous abnormalities are seen. IMPRESSION: No active disease. Electronically Signed   By: Andrea Gasman M.D.   On: 04/01/2024 17:39        Scheduled Meds:  acetaminophen   500 mg Oral QHS   amLODipine   10 mg Oral QHS   aspirin  EC  81 mg Oral Daily   atorvastatin   10 mg Oral QPM   enoxaparin  (LOVENOX ) injection  40 mg Subcutaneous Q24H   irbesartan   150 mg Oral Daily   levothyroxine   25 mcg Oral Q0600   vitamin B-12  100 mcg Oral Daily   Continuous Infusions:  cefTRIAXone  (ROCEPHIN )  IV 2 g (04/03/24 0523)     LOS: 2 days    Time spent: 52 minutes spent on 04/03/2024 caring for this patient face-to-face including chart review, ordering labs/tests, documenting, discussion with nursing staff, consultants, updating family and interview/physical exam    Camellia PARAS Venise Ellingwood, DO [facility] Available via Epic secure chat 7am-7pm After these hours, please refer to coverage provider listed on [email or web address] 04/03/2024, 1:16 PM   "

## 2024-04-03 NOTE — Progress Notes (Signed)
 Mobility Specialist Progress Note:   04/03/24 1547  Mobility  Activity Ambulated with assistance  Level of Assistance Minimal assist, patient does 75% or more  Assistive Device Front wheel walker  Distance Ambulated (ft) 160 ft  Activity Response Tolerated well  Mobility Referral Yes  Mobility visit 1 Mobility  Mobility Specialist Start Time (ACUTE ONLY) 1520  Mobility Specialist Stop Time (ACUTE ONLY) 1529  Mobility Specialist Time Calculation (min) (ACUTE ONLY) 9 min   Pt was received in recliner and agreed to mobility. No complaints/issues during ambulation. Returned to bed with all needs met. Call bell in reach.  [employer] - Mobility Specialist

## 2024-04-04 DIAGNOSIS — A419 Sepsis, unspecified organism: Secondary | ICD-10-CM | POA: Diagnosis not present

## 2024-04-04 LAB — BASIC METABOLIC PANEL WITH GFR
Anion gap: 10 (ref 5–15)
BUN: 31 mg/dL — ABNORMAL HIGH (ref 8–23)
CO2: 23 mmol/L (ref 22–32)
Calcium: 8.9 mg/dL (ref 8.9–10.3)
Chloride: 106 mmol/L (ref 98–111)
Creatinine, Ser: 1.11 mg/dL (ref 0.61–1.24)
GFR, Estimated: >60 mL/min
Glucose, Bld: 131 mg/dL — ABNORMAL HIGH (ref 70–99)
Potassium: 3.7 mmol/L (ref 3.5–5.1)
Sodium: 140 mmol/L (ref 135–145)

## 2024-04-04 LAB — CBC
HCT: 32.6 % — ABNORMAL LOW (ref 39.0–52.0)
Hemoglobin: 11 g/dL — ABNORMAL LOW (ref 13.0–17.0)
MCH: 30.5 pg (ref 26.0–34.0)
MCHC: 33.7 g/dL (ref 30.0–36.0)
MCV: 90.3 fL (ref 80.0–100.0)
Platelets: 211 K/uL (ref 150–400)
RBC: 3.61 MIL/uL — ABNORMAL LOW (ref 4.22–5.81)
RDW: 13.7 % (ref 11.5–15.5)
WBC: 13.9 K/uL — ABNORMAL HIGH (ref 4.0–10.5)
nRBC: 0 % (ref 0.0–0.2)

## 2024-04-04 MED ORDER — CEFADROXIL 500 MG PO CAPS
500.0000 mg | ORAL_CAPSULE | Freq: Two times a day (BID) | ORAL | Status: DC
  Administered 2024-04-04 – 2024-04-05 (×2): 500 mg via ORAL
  Filled 2024-04-04: qty 1

## 2024-04-04 NOTE — Progress Notes (Signed)
 " PROGRESS NOTE  GRANITE GODMAN  FMW:[id number] DOB: 10/12/1931 DOA: 04/01/2024 PCP: Darlean Ozell NOVAK, MD   Brief Narrative: Bruce Dixon is a 89 y.o. male with past medical history significant for HTN, HLD, hypothyroidism, vitamin B12 deficiency, osteoarthritis who presented to [facility] ED on 04/01/2024 with progressive weakness resulting in a fall at home.  Currently resident of [facility].  Patient reports that after having lunch he slid down and fell towards the floor, denies loss of consciousness or head injury.  UA suspicious for UTI.  Urine culture showing Klebsiella.  Started on ceftriaxone .  PT recommending SNF on discharge.  TOC consulted.  Medically stable for discharge whenever possible.  Assessment & Plan:  Principal Problem:   Sepsis (HCC) Active Problems:   Sepsis secondary to UTI (HCC)   Sepsis, POA Klebsiella aerogenes urinary tract infection Patient presenting with 3-day history of progressive weakness resulting in fall without loss of consciousness.  Patient endorses increased urination.  Patient was afebrile, tachycardic with elevated WBC count of 30.3 on admission.  Lactic acid 1.1.  Urinalysis consistent with urinary tract infection. ---- Blood cultures x 2: no growth  -- Urine culture: + Klebsiella aerogenes -- Ceftriaxone  2 g IV every 24 hours,changing to oral -- Leukocytosis significantly improved.  Currently febrile   HTN -- Irbesartan  150 mg p.o. daily (substituted for home valsartan  160 mg p.o. daily) -- Amlodipine  10 mg p.o. daily -- Continue aspirin  and statin   HLD -- Atorvastatin  10 mg p.o. daily    Hypothyroidism -- Levothyroxine  25 mcg p.o. daily   B12 deficiency -- Cyanocobalamin  100 mcg p.o. daily   Weakness/debility/deconditioning: -- PT recommending SNF placement -- From [place] [facility], TOC consulted            DVT prophylaxis:enoxaparin  (LOVENOX ) injection 40 mg Start: 04/01/24 2200 SCDs Start: 04/01/24  1945     Code Status: Full Code  Family Communication: None at bedside  Patient status:Inpatient  Patient is from :SNF  Anticipated discharge to:SNF  Estimated DC date:whenever possible   Consultants: None  Procedures:None  Antimicrobials:  Anti-infectives (From admission, onward)    Start     Dose/Rate Route Frequency Ordered Stop   04/02/24 0600  cefTRIAXone  (ROCEPHIN ) 2 g in sodium chloride  0.9 % 100 mL IVPB        2 g 200 mL/hr over 30 Minutes Intravenous Every 24 hours 04/01/24 1947     04/01/24 1845  ceFEPIme  (MAXIPIME ) 2 g in sodium chloride  0.9 % 100 mL IVPB        2 g 200 mL/hr over 30 Minutes Intravenous  Once 04/01/24 1834 04/01/24 1928       Subjective: Patient seen and examined at bedside today.  Comfortable lying on bed.  No new complaints.  Feels ready to be discharged to SNF whenever possible.  Objective: Vitals:   04/03/24 1100 04/03/24 1958 04/03/24 2139 04/04/24 0617  BP: 126/61 (!) 131/56 (!) 131/56 134/65  Pulse:  84  90  Resp:  17  18  Temp:  99.1 F (37.3 C)  98.1 F (36.7 C)  TempSrc:  Oral  Oral  SpO2:  93%  97%  Weight:      Height:        Intake/Output Summary (Last 24 hours) at 04/04/2024 1129 Last data filed at 04/04/2024 0800 Gross per 24 hour  Intake --  Output 350 ml  Net -350 ml   Filed Weights   04/01/24 1655  Weight: 74.4  kg    Examination:  General exam: Overall comfortable, not in distress, pleasant elderly gentleman HEENT: PERRL Respiratory system:  no wheezes or crackles  Cardiovascular system: S1 & S2 heard, RRR.  Gastrointestinal system: Abdomen is nondistended, soft and nontender. Central nervous system: Alert and oriented Extremities: No edema, no clubbing ,no cyanosis Skin: No rashes, no ulcers,no icterus     Data Reviewed: I have personally reviewed following labs and imaging studies  CBC: Recent Labs  Lab 04/01/24 1736 04/01/24 2103 04/02/24 0500 04/03/24 0604 04/04/24 0645  WBC 30.3*  27.9* 28.5* 22.5* 13.9*  NEUTROABS 26.7*  --   --   --   --   HGB 11.8* 11.0* 11.9* 11.6* 11.0*  HCT 34.7* 32.7* 35.8* 33.9* 32.6*  MCV 90.6 91.1 92.0 90.6 90.3  PLT 200 178 189 195 211   Basic Metabolic Panel: Recent Labs  Lab 04/01/24 1736 04/01/24 2103 04/02/24 0500 04/03/24 0604 04/04/24 0645  NA 135  --  142 141 140  K 4.2  --  3.9 4.1 3.7  CL 104  --  109 106 106  CO2 19*  --  21* 19* 23  GLUCOSE 156*  --  125* 116* 131*  BUN 24*  --  20 27* 31*  CREATININE 1.31* 1.20 1.03 1.07 1.11  CALCIUM  9.4  --  9.1 9.0 8.9     Recent Results (from the past 240 hours)  Resp panel by RT-PCR (RSV, Flu A&B, Covid) Anterior Nasal Swab     Status: None   Collection Time: 04/01/24  5:35 PM   Specimen: Anterior Nasal Swab  Result Value Ref Range Status   SARS Coronavirus 2 by RT PCR NEGATIVE NEGATIVE Final    Comment: (NOTE) SARS-CoV-2 target nucleic acids are NOT DETECTED.  The SARS-CoV-2 RNA is generally detectable in upper respiratory specimens during the acute phase of infection. The lowest concentration of SARS-CoV-2 viral copies this assay can detect is 138 copies/mL. A negative result does not preclude SARS-Cov-2 infection and should not be used as the sole basis for treatment or other patient management decisions. A negative result may occur with  improper specimen collection/handling, submission of specimen other than nasopharyngeal swab, presence of viral mutation(s) within the areas targeted by this assay, and inadequate number of viral copies(<138 copies/mL). A negative result must be combined with clinical observations, patient history, and epidemiological information. The expected result is Negative.  Fact Sheet for Patients:  bloggercourse.com  Fact Sheet for Healthcare Providers:  seriousbroker.it  This test is no t yet approved or cleared by the [place]  FDA and  has been authorized for detection and/or  diagnosis of SARS-CoV-2 by FDA under an Emergency Use Authorization (EUA). This EUA will remain  in effect (meaning this test can be used) for the duration of the COVID-19 declaration under Section 564(b)(1) of the Act, 21 U.S.C.section 360bbb-3(b)(1), unless the authorization is terminated  or revoked sooner.       Influenza A by PCR NEGATIVE NEGATIVE Final   Influenza B by PCR NEGATIVE NEGATIVE Final    Comment: (NOTE) The Xpert Xpress SARS-CoV-2/FLU/RSV plus assay is intended as an aid in the diagnosis of influenza from Nasopharyngeal swab specimens and should not be used as a sole basis for treatment. Nasal washings and aspirates are unacceptable for Xpert Xpress SARS-CoV-2/FLU/RSV testing.  Fact Sheet for Patients: bloggercourse.com  Fact Sheet for Healthcare Providers: seriousbroker.it  This test is not yet approved or cleared by the [place]  FDA and has been  authorized for detection and/or diagnosis of SARS-CoV-2 by FDA under an Emergency Use Authorization (EUA). This EUA will remain in effect (meaning this test can be used) for the duration of the COVID-19 declaration under Section 564(b)(1) of the Act, 21 U.S.C. section 360bbb-3(b)(1), unless the authorization is terminated or revoked.     Resp Syncytial Virus by PCR NEGATIVE NEGATIVE Final    Comment: (NOTE) Fact Sheet for Patients: bloggercourse.com  Fact Sheet for Healthcare Providers: seriousbroker.it  This test is not yet approved or cleared by the [place]  FDA and has been authorized for detection and/or diagnosis of SARS-CoV-2 by FDA under an Emergency Use Authorization (EUA). This EUA will remain in effect (meaning this test can be used) for the duration of the COVID-19 declaration under Section 564(b)(1) of the Act, 21 U.S.C. section 360bbb-3(b)(1), unless the authorization is terminated  or revoked.  Performed at [facility], [address], [place], [place] [address]   Urine Culture     Status: Abnormal   Collection Time: 04/01/24  5:37 PM   Specimen: Urine, Clean Catch  Result Value Ref Range Status   Specimen Description   Final    URINE, CLEAN CATCH Performed at [facility], [address] [place], [place] [address]    Special Requests   Final    NONE Reflexed from [id number] Performed at [facility], [address], [place], [place] [address]    Culture >=100,000 COLONIES/mL KLEBSIELLA AEROGENES (A)  Final   Report Status 04/03/2024 FINAL  Final   Organism ID, Bacteria KLEBSIELLA AEROGENES (A)  Final      Susceptibility   Klebsiella aerogenes - MIC*    CEFEPIME  <=0.12 SENSITIVE Sensitive     ERTAPENEM <=0.12 SENSITIVE Sensitive     CEFTRIAXONE  <=0.25 SENSITIVE Sensitive     CIPROFLOXACIN <=0.06 SENSITIVE Sensitive     GENTAMICIN <=1 SENSITIVE Sensitive     NITROFURANTOIN 128 RESISTANT Resistant     TRIMETH/SULFA <=20 SENSITIVE Sensitive     PIP/TAZO Value in next row Sensitive      <=4 SENSITIVEThis is a modified FDA-approved test that has been validated and its performance characteristics determined by the reporting laboratory.  This laboratory is certified under the Clinical Laboratory Improvement Amendments CLIA as qualified to perform high complexity clinical laboratory testing.    MEROPENEM Value in next row Sensitive      <=4 SENSITIVEThis is a modified FDA-approved test that has been validated and its performance characteristics determined by the reporting laboratory.  This laboratory is certified under the Clinical Laboratory Improvement Amendments CLIA as qualified to perform high complexity clinical laboratory testing.    * >=100,000 COLONIES/mL KLEBSIELLA AEROGENES  Blood culture (routine x 2)     Status: None (Preliminary result)   Collection Time: 04/01/24  6:14 PM   Specimen: BLOOD RIGHT ARM  Result  Value Ref Range Status   Specimen Description   Final    BLOOD RIGHT ARM Performed at [facility], [address] [place], [place] [address]    Special Requests   Final    BOTTLES DRAWN AEROBIC AND ANAEROBIC Blood Culture adequate volume Performed at [facility], [address], [place], [place] [address]    Culture   Final    NO GROWTH 3 DAYS Performed at [facility], [address], [place], [place] [address]    Report Status PENDING  Incomplete  Blood culture (routine x 2)  Status: None (Preliminary result)   Collection Time: 04/01/24  6:28 PM   Specimen: BLOOD RIGHT HAND  Result Value Ref Range Status   Specimen Description   Final    BLOOD RIGHT HAND Performed at [facility], [address], [place], [place] [address]    Special Requests   Final    BOTTLES DRAWN AEROBIC AND ANAEROBIC Blood Culture adequate volume Performed at [facility], [address] [place], [place] [address]    Culture   Final    NO GROWTH 3 DAYS Performed at [facility], [address], [place], [place] [address]    Report Status PENDING  Incomplete     Radiology Studies: No results found.  Scheduled Meds:  acetaminophen   500 mg Oral QHS   amLODipine   10 mg Oral QHS   aspirin  EC  81 mg Oral Daily   atorvastatin   10 mg Oral QPM   enoxaparin  (LOVENOX ) injection  40 mg Subcutaneous Q24H   irbesartan   150 mg Oral Daily   levothyroxine   25 mcg Oral Q0600   vitamin B-12  100 mcg Oral Daily   Continuous Infusions:  cefTRIAXone  (ROCEPHIN )  IV 2 g (04/04/24 0612)     LOS: 3 days   Ivonne Mustache, MD [facility] P12/28/2025, 11:29 AM  "

## 2024-04-04 NOTE — Plan of Care (Signed)

## 2024-04-05 ENCOUNTER — Other Ambulatory Visit (HOSPITAL_COMMUNITY): Payer: Self-pay

## 2024-04-05 DIAGNOSIS — A419 Sepsis, unspecified organism: Secondary | ICD-10-CM | POA: Diagnosis not present

## 2024-04-05 LAB — CBC
HCT: 33.6 % — ABNORMAL LOW (ref 39.0–52.0)
Hemoglobin: 11.4 g/dL — ABNORMAL LOW (ref 13.0–17.0)
MCH: 30.4 pg (ref 26.0–34.0)
MCHC: 33.9 g/dL (ref 30.0–36.0)
MCV: 89.6 fL (ref 80.0–100.0)
Platelets: 231 K/uL (ref 150–400)
RBC: 3.75 MIL/uL — ABNORMAL LOW (ref 4.22–5.81)
RDW: 13.5 % (ref 11.5–15.5)
WBC: 11 K/uL — ABNORMAL HIGH (ref 4.0–10.5)
nRBC: 0 % (ref 0.0–0.2)

## 2024-04-05 MED ORDER — CEFADROXIL 500 MG PO CAPS: 500.0000 mg | ORAL_CAPSULE | Freq: Two times a day (BID) | ORAL | Status: AC

## 2024-04-05 MED ORDER — CEFADROXIL 500 MG PO CAPS
500.0000 mg | ORAL_CAPSULE | Freq: Two times a day (BID) | ORAL | 0 refills | Status: DC
  Filled 2024-04-05: qty 5, 3d supply, fill #0

## 2024-04-05 NOTE — Care Management Important Message (Signed)
 Important Message  Patient Details IM Letter given. Name: Bruce Dixon MRN: [id number] Date of Birth: 06/12/1931   Important Message Given:  Yes - Medicare IM     Melba Ates 04/05/2024, 2:35 PM

## 2024-04-05 NOTE — Progress Notes (Signed)
 Mobility Specialist Progress Note:   04/05/24 1421  Mobility  Activity Ambulated with assistance  Level of Assistance Contact guard assist, steadying assist  Assistive Device Front wheel walker  Distance Ambulated (ft) 250 ft  Activity Response Tolerated well  Mobility Referral Yes  Mobility visit 1 Mobility  Mobility Specialist Start Time (ACUTE ONLY) 1330  Mobility Specialist Stop Time (ACUTE ONLY) 1340  Mobility Specialist Time Calculation (min) (ACUTE ONLY) 10 min   Pt was received in recliner and agreed to mobility. No complaints/issues during ambulation. Returned to recliner with all needs met. Call bell in reach.  [employer] - Mobility Specialist

## 2024-04-05 NOTE — [place] FL2 (Signed)
 " [place]  MEDICAID [id number] LEVEL OF CARE FORM     IDENTIFICATION  Patient Name: Bruce Dixon Birthdate: 1931/05/22 Sex: male Admission Date (Current Location): 04/01/2024  [facility] and [place] Number:  Producer, Television/film/video and Address:  [facility],  [address] [place], [place] [address]      Provider Number: [id number]  Attending Physician Name and Address:  Jillian Buttery, MD  Relative Name and Phone Number:       Current Level of Care: Hospital Recommended Level of Care: Skilled Nursing Facility Prior Approval Number:    Date Approved/Denied: 04/05/24 PASRR Number: [id number]  Discharge Plan: SNF    Current Diagnoses: Patient Active Problem List   Diagnosis Date Noted   Sepsis secondary to UTI (HCC) 04/02/2024   Sepsis (HCC) 04/01/2024   Skin rash 08/21/2010   Vitamin D  deficiency 01/23/2010   ELECTROCARDIOGRAM, ABNORMAL 01/20/2009   Hyperlipidemia 01/21/2008   Essential hypertension 01/21/2008   Idiopathic scoliosis and kyphoscoliosis 01/21/2008   Morbid obesity (HCC) 01/21/2008   Hx of colonic polyps 01/21/2008    Orientation RESPIRATION BLADDER Height & Weight     Self, Time, Situation, Place  Normal Continent Weight: 74.4 kg Height:  5' 3 (160 cm)  BEHAVIORAL SYMPTOMS/MOOD NEUROLOGICAL BOWEL NUTRITION STATUS      Continent Diet  AMBULATORY STATUS COMMUNICATION OF NEEDS Skin   Extensive Assist Verbally Normal                       Personal Care Assistance Level of Assistance  Bathing, Feeding, Dressing Bathing Assistance: Limited assistance Feeding assistance: Limited assistance Dressing Assistance: Limited assistance     Functional Limitations Info  Sight, Hearing, Speech Sight Info: Impaired Hearing Info: Adequate Speech Info: Adequate    SPECIAL CARE FACTORS FREQUENCY  PT (By licensed PT), OT (By licensed OT)     PT Frequency: 5x weekly OT Frequency: 5x weekly            Contractures Contractures Info: Not present     Additional Factors Info  Code Status, Allergies Code Status Info: FULL Allergies Info: NDK           Current Medications (04/05/2024):  This is the current hospital active medication list Current Facility-Administered Medications  Medication Dose Route Frequency Provider Last Rate Last Admin   acetaminophen  (TYLENOL ) tablet 650 mg  650 mg Oral Q6H PRN Dorrell, Hersel, MD       Or   acetaminophen  (TYLENOL ) suppository 650 mg  650 mg Rectal Q6H PRN Dorrell, Jamori, MD       acetaminophen  (TYLENOL ) tablet 500 mg  500 mg Oral QHS Dena Lamar, MD   500 mg at 04/04/24 2125   amLODipine  (NORVASC ) tablet 10 mg  10 mg Oral QHS Dorrell, Bronsen, MD   10 mg at 04/04/24 2125   aspirin  EC tablet 81 mg  81 mg Oral Daily Dena Lamar, MD   81 mg at 04/05/24 9095   atorvastatin  (LIPITOR) tablet 10 mg  10 mg Oral QPM Dena Lamar, MD   10 mg at 04/04/24 1706   cefadroxil  (DURICEF) capsule 500 mg  500 mg Oral BID Jillian Buttery, MD   500 mg at 04/05/24 9095   enoxaparin  (LOVENOX ) injection 40 mg  40 mg Subcutaneous Q24H Dena Lamar, MD   40 mg at 04/04/24 2126   irbesartan  (AVAPRO ) tablet 150 mg  150 mg Oral Daily Dorrell, Harman, MD   150 mg at 04/05/24 0904   levothyroxine  (SYNTHROID )  tablet 25 mcg  25 mcg Oral Q0600 Dena Charleston, MD   25 mcg at 04/05/24 0550   ondansetron  (ZOFRAN ) tablet 4 mg  4 mg Oral Q6H PRN Dena Charleston, MD       Or   ondansetron  (ZOFRAN ) injection 4 mg  4 mg Intravenous Q6H PRN Dorrell, Lendell, MD       oxyCODONE  (Oxy IR/ROXICODONE ) immediate release tablet 5 mg  5 mg Oral Q4H PRN Dorrell, Bijon, MD       vitamin B-12 (CYANOCOBALAMIN ) tablet 100 mcg  100 mcg Oral Daily Austria, Eric J, DO   100 mcg at 04/05/24 9095     Discharge Medications: Please see discharge summary for a list of discharge medications.  Relevant Imaging Results:  Relevant Lab Results:   Additional Information SSN [id number]  Doneta Glenys DASEN, RN     "

## 2024-04-05 NOTE — Discharge Summary (Addendum)
 Physician Discharge Summary  ELIMELECH HOUSEMAN FMW:[id number] DOB: [birth year] DOA: 04/01/2024  PCP: Darlean Ozell NOVAK, MD  Admit date: 04/01/2024 Discharge date: 04/05/2024  Admitted From: ALF Disposition:  SNF  Discharge Condition:Stable CODE STATUS:FULL Diet recommendation:  Heart healthy   Brief/Interim Summary: Bruce Dixon is a 89 y.o. male with past medical history significant for HTN, HLD, hypothyroidism, vitamin B12 deficiency, osteoarthritis who presented to [facility] ED on 04/01/2024 with progressive weakness resulting in a fall at home.  Currently resident of [facility].  Patient reports that after having lunch he slid down and fell towards the floor, denies loss of consciousness or head injury.  UA suspicious for UTI.  Urine culture showing Klebsiella.  Started on ceftriaxone .  PT recommending SNF on discharge .  TOC was following.  Medically stable for discharge today.  Antibiotics changed to oral.  Following problems were addressed during the hospitalization:  Sepsis, POA Klebsiella aerogenes urinary tract infection Patient presenting with 3-day history of progressive weakness resulting in fall without loss of consciousness.  Patient endorses increased urination.  Patient was afebrile, tachycardic with elevated WBC count of 30.3 on admission. Blood cultures x 2: no growth  -- Urine culture: + Klebsiella aerogenes -- Leukocytosis significantly improved.  Currently febrile - Antibiotics in total   HTN --Continue home antihypertensives -- Continue aspirin  and statin   HLD -- Atorvastatin  10 mg p.o. daily    Hypothyroidism -- Levothyroxine  25 mcg p.o. daily   B12 deficiency -- Cyanocobalamin  100 mcg p.o. daily   Weakness/debility/deconditioning: -- PT recommending SNF placement      Discharge Diagnoses:  Principal Problem:   Sepsis (HCC) Active Problems:   Sepsis secondary to UTI [facility] [place])    Discharge Instructions  Discharge Instructions      Diet general   Complete by: As directed    Discharge instructions   Complete by: As directed    1)Please take your medications as instructed 2)Follow up with your PCP in a week   Increase activity slowly   Complete by: As directed       Allergies as of 04/05/2024   No Known Allergies      Medication List     STOP taking these medications    amLODipine -valsartan  5-160 MG tablet Commonly known as: EXFORGE        TAKE these medications    acetaminophen  500 MG tablet Commonly known as: TYLENOL  Take 500 mg by mouth at bedtime.   amLODipine  10 MG tablet Commonly known as: NORVASC  Take 10 mg by mouth at bedtime.   aspirin  81 MG tablet Take 81 mg by mouth daily.   atorvastatin  10 MG tablet Commonly known as: LIPITOR 1/2 tablet daily What changed:  how much to take how to take this when to take this additional instructions   cefadroxil  500 MG capsule Commonly known as: DURICEF Take 1 capsule (500 mg total) by mouth 2 (two) times daily for 5 doses.   levothyroxine  25 MCG tablet Commonly known as: SYNTHROID  TAKE ONE (1) TABLET BY MOUTH EACH DAY   valsartan  160 MG tablet Commonly known as: DIOVAN  Take 160 mg by mouth every evening.   vitamin B-12 100 MCG tablet Commonly known as: CYANOCOBALAMIN  Take 100 mcg by mouth daily.               Durable Medical Equipment  (From admission, onward)           Start     Ordered   04/03/24 1321  For home use only DME 4 wheeled rolling walker with seat  Once       Question:  Patient needs a walker to treat with the following condition  Answer:  Gait abnormality   04/03/24 1320            Follow-up Information     Darlean Ozell NOVAK, MD. Schedule an appointment as soon as possible for a visit in 1 week(s).   Specialty: Pulmonary Disease Contact information: [address] [place] [address] [phone]                 Allergies[1]  Consultations: None   Procedures/Studies: CT HEAD WO CONTRAST ( ) Result Date: 04/01/2024 EXAM: CT HEAD WITHOUT CONTRAST 04/01/2024 05:50:21 PM TECHNIQUE: CT of the head was performed without the administration of intravenous contrast. Automated exposure control, iterative reconstruction, and/or weight based adjustment of the mA/kV was utilized to reduce the radiation dose to as low as reasonably achievable. COMPARISON: None available. CLINICAL HISTORY: Mental status change, unknown cause FINDINGS: BRAIN AND VENTRICLES: No acute hemorrhage. No evidence of acute infarct. No hydrocephalus. No extra-axial collection. No mass effect or midline shift. ORBITS: No acute abnormality. SINUSES: No acute abnormality. SOFT TISSUES AND SKULL: No acute soft tissue abnormality. No skull fracture. IMPRESSION: 1. No acute intracranial abnormality. Electronically signed by: Gilmore Molt 04/01/2024 06:04 PM EST RP Workstation: [id number]   DG Forearm Left Result Date: 04/01/2024 CLINICAL DATA:  Unwitnessed fall. EXAM: LEFT FOREARM - 2 VIEW COMPARISON:  None Available. FINDINGS: There is no evidence of fracture or other focal bone lesions. Wrist and elbow alignment are maintained. Question mild soft tissue edema distally. IMPRESSION: No fracture of the left forearm. Question mild soft tissue edema distally. Electronically Signed   By: Andrea Gasman M.D.   On: 04/01/2024 17:40   DG Chest 1 View Result Date: 04/01/2024 CLINICAL DATA:  Unwitnessed fall.  Weakness. EXAM: CHEST  1 VIEW COMPARISON:  02/09/2016 FINDINGS: The cardiomediastinal contours are stable. The lungs are clear. Pulmonary vasculature is normal. No consolidation, pleural effusion, or pneumothorax. Chronic left upper rib deformities. Scoliotic curvature of the spine. No acute osseous abnormalities are seen. IMPRESSION: No active disease. Electronically Signed   By: Andrea Gasman M.D.   On: 04/01/2024 17:39       Subjective: Patient seen and examined at bedside today.  Hemodynamically stable.  Overall comfortable.  No new complaints.  Alert and oriented.  Lying on bed.  Very eager to be discharged today.  Discharge Exam: Vitals:   04/05/24 0612 04/05/24 1217  BP: 124/62 (!) 114/53  Pulse: 71 87  Resp: 18   Temp: 97.9 F (36.6 C) 98.2 F (36.8 C)  SpO2: 95%    Vitals:   04/04/24 1209 04/04/24 2117 04/05/24 0612 04/05/24 1217  BP: 137/60 133/65 124/62 (!) 114/53  Pulse: 86 84 71 87  Resp: 18 18 18    Temp: 98.6 F (37 C) 98.1 F (36.7 C) 97.9 F (36.6 C) 98.2 F (36.8 C)  TempSrc:    Oral  SpO2: 94% 97% 95%   Weight:      Height:        General: Pt is alert, awake, not in acute distress Cardiovascular: RRR, S1/S2 +, no rubs, no gallops Respiratory: CTA bilaterally, no wheezing, no rhonchi Abdominal: Soft, NT, ND, bowel sounds + Extremities: no edema, no cyanosis    The results of significant diagnostics from this hospitalization (including imaging, microbiology, ancillary and laboratory) are listed below  for reference.     Microbiology: Recent Results (from the past 240 hours)  Resp panel by RT-PCR (RSV, Flu A&B, Covid) Anterior Nasal Swab     Status: None   Collection Time: 04/01/24  5:35 PM   Specimen: Anterior Nasal Swab  Result Value Ref Range Status   SARS Coronavirus 2 by RT PCR NEGATIVE NEGATIVE Final    Comment: (NOTE) SARS-CoV-2 target nucleic acids are NOT DETECTED.  The SARS-CoV-2 RNA is generally detectable in upper respiratory specimens during the acute phase of infection. The lowest concentration of SARS-CoV-2 viral copies this assay can detect is 138 copies/mL. A negative result does not preclude SARS-Cov-2 infection and should not be used as the sole basis for treatment or other patient management decisions. A negative result may occur with  improper specimen collection/handling, submission of specimen other than nasopharyngeal swab, presence of  viral mutation(s) within the areas targeted by this assay, and inadequate number of viral copies(<138 copies/mL). A negative result must be combined with clinical observations, patient history, and epidemiological information. The expected result is Negative.  Fact Sheet for Patients:  bloggercourse.com  Fact Sheet for Healthcare Providers:  seriousbroker.it  This test is no t yet approved or cleared by the [place]  FDA and  has been authorized for detection and/or diagnosis of SARS-CoV-2 by FDA under an Emergency Use Authorization (EUA). This EUA will remain  in effect (meaning this test can be used) for the duration of the COVID-19 declaration under Section 564(b)(1) of the Act, 21 U.S.C.section 360bbb-3(b)(1), unless the authorization is terminated  or revoked sooner.       Influenza A by PCR NEGATIVE NEGATIVE Final   Influenza B by PCR NEGATIVE NEGATIVE Final    Comment: (NOTE) The Xpert Xpress SARS-CoV-2/FLU/RSV plus assay is intended as an aid in the diagnosis of influenza from Nasopharyngeal swab specimens and should not be used as a sole basis for treatment. Nasal washings and aspirates are unacceptable for Xpert Xpress SARS-CoV-2/FLU/RSV testing.  Fact Sheet for Patients: bloggercourse.com  Fact Sheet for Healthcare Providers: seriousbroker.it  This test is not yet approved or cleared by the [place]  FDA and has been authorized for detection and/or diagnosis of SARS-CoV-2 by FDA under an Emergency Use Authorization (EUA). This EUA will remain in effect (meaning this test can be used) for the duration of the COVID-19 declaration under Section 564(b)(1) of the Act, 21 U.S.C. section 360bbb-3(b)(1), unless the authorization is terminated or revoked.     Resp Syncytial Virus by PCR NEGATIVE NEGATIVE Final    Comment: (NOTE) Fact Sheet for  Patients: bloggercourse.com  Fact Sheet for Healthcare Providers: seriousbroker.it  This test is not yet approved or cleared by the [place]  FDA and has been authorized for detection and/or diagnosis of SARS-CoV-2 by FDA under an Emergency Use Authorization (EUA). This EUA will remain in effect (meaning this test can be used) for the duration of the COVID-19 declaration under Section 564(b)(1) of the Act, 21 U.S.C. section 360bbb-3(b)(1), unless the authorization is terminated or revoked.  Performed at [facility], [address], [place], [place] [address]   Urine Culture     Status: Abnormal   Collection Time: 04/01/24  5:37 PM   Specimen: Urine, Clean Catch  Result Value Ref Range Status   Specimen Description   Final    URINE, CLEAN CATCH Performed at [facility], [address], [place], [place] [address]    Special Requests   Final  NONE Reflexed from [id number] Performed at [facility], [address], [place], [place] [address]    Culture >=100,000 COLONIES/mL KLEBSIELLA AEROGENES (A)  Final   Report Status 04/03/2024 FINAL  Final   Organism ID, Bacteria KLEBSIELLA AEROGENES (A)  Final      Susceptibility   Klebsiella aerogenes - MIC*    CEFEPIME  <=0.12 SENSITIVE Sensitive     ERTAPENEM <=0.12 SENSITIVE Sensitive     CEFTRIAXONE  <=0.25 SENSITIVE Sensitive     CIPROFLOXACIN <=0.06 SENSITIVE Sensitive     GENTAMICIN <=1 SENSITIVE Sensitive     NITROFURANTOIN 128 RESISTANT Resistant     TRIMETH/SULFA <=20 SENSITIVE Sensitive     PIP/TAZO Value in next row Sensitive      <=4 SENSITIVEThis is a modified FDA-approved test that has been validated and its performance characteristics determined by the reporting laboratory.  This laboratory is certified under the Clinical Laboratory Improvement Amendments CLIA as qualified to perform high complexity clinical laboratory testing.     MEROPENEM Value in next row Sensitive      <=4 SENSITIVEThis is a modified FDA-approved test that has been validated and its performance characteristics determined by the reporting laboratory.  This laboratory is certified under the Clinical Laboratory Improvement Amendments CLIA as qualified to perform high complexity clinical laboratory testing.    * >=100,000 COLONIES/mL KLEBSIELLA AEROGENES  Blood culture (routine x 2)     Status: None (Preliminary result)   Collection Time: 04/01/24  6:14 PM   Specimen: BLOOD RIGHT ARM  Result Value Ref Range Status   Specimen Description   Final    BLOOD RIGHT ARM Performed at [facility], [address], [place], [place] [address]    Special Requests   Final    BOTTLES DRAWN AEROBIC AND ANAEROBIC Blood Culture adequate volume Performed at [facility], [address] [place], [place] [address]    Culture   Final    NO GROWTH 4 DAYS Performed at [facility], [address], [place], [place] [address]    Report Status PENDING  Incomplete  Blood culture (routine x 2)     Status: None (Preliminary result)   Collection Time: 04/01/24  6:28 PM   Specimen: BLOOD RIGHT HAND  Result Value Ref Range Status   Specimen Description   Final    BLOOD RIGHT HAND Performed at [facility], [address] [place], [place] [address]    Special Requests   Final    BOTTLES DRAWN AEROBIC AND ANAEROBIC Blood Culture adequate volume Performed at [facility], [address], [place], [place] [address]    Culture   Final    NO GROWTH 4 DAYS Performed at [facility], [address], [place], [place] [address]    Report Status PENDING  Incomplete     Labs: BNP (last 3 results) No results for input(s): BNP in the last 8760 hours. Basic Metabolic Panel: Recent Labs  Lab 04/01/24 1736 04/01/24 2103 04/02/24 0500 04/03/24 0604 04/04/24 0645  NA 135  --  142 141 140  K 4.2   --  3.9 4.1 3.7  CL 104  --  109 106 106  CO2 19*  --  21* 19* 23  GLUCOSE 156*  --  125* 116* 131*  BUN 24*  --  20 27* 31*  CREATININE 1.31* 1.20 1.03 1.07 1.11  CALCIUM  9.4  --  9.1 9.0 8.9   Liver Function  Tests: Recent Labs  Lab 04/01/24 1736  AST 26  ALT 10  ALKPHOS 73  BILITOT 0.5  PROT 7.1  ALBUMIN 4.3   No results for input(s): LIPASE, AMYLASE in the last 168 hours. No results for input(s): AMMONIA in the last 168 hours. CBC: Recent Labs  Lab 04/01/24 1736 04/01/24 2103 04/02/24 0500 04/03/24 0604 04/04/24 0645 04/05/24 0558  WBC 30.3* 27.9* 28.5* 22.5* 13.9* 11.0*  NEUTROABS 26.7*  --   --   --   --   --   HGB 11.8* 11.0* 11.9* 11.6* 11.0* 11.4*  HCT 34.7* 32.7* 35.8* 33.9* 32.6* 33.6*  MCV 90.6 91.1 92.0 90.6 90.3 89.6  PLT 200 178 189 195 211 231   Cardiac Enzymes: No results for input(s): CKTOTAL, CKMB, CKMBINDEX, TROPONINI in the last 168 hours. BNP: Invalid input(s): POCBNP CBG: No results for input(s): GLUCAP in the last 168 hours. D-Dimer No results for input(s): DDIMER in the last 72 hours. Hgb A1c No results for input(s): HGBA1C in the last 72 hours. Lipid Profile No results for input(s): CHOL, HDL, LDLCALC, TRIG, CHOLHDL, LDLDIRECT in the last 72 hours. Thyroid  function studies No results for input(s): TSH, T4TOTAL, T3FREE, THYROIDAB in the last 72 hours.  Invalid input(s): FREET3 Anemia work up No results for input(s): VITAMINB12, FOLATE, FERRITIN, TIBC, IRON, RETICCTPCT in the last 72 hours. Urinalysis    Component Value Date/Time   COLORURINE YELLOW 04/01/2024 1737   APPEARANCEUR HAZY (A) 04/01/2024 1737   LABSPEC 1.011 04/01/2024 1737   PHURINE 5.0 04/01/2024 1737   GLUCOSEU NEGATIVE 04/01/2024 1737   GLUCOSEU NEGATIVE 02/09/2016 0922   HGBUR MODERATE (A) 04/01/2024 1737   BILIRUBINUR NEGATIVE 04/01/2024 1737   KETONESUR NEGATIVE 04/01/2024 1737   PROTEINUR NEGATIVE  04/01/2024 1737   UROBILINOGEN 0.2 02/09/2016 0922   NITRITE POSITIVE (A) 04/01/2024 1737   LEUKOCYTESUR SMALL (A) 04/01/2024 1737   Sepsis Labs Recent Labs  Lab 04/02/24 0500 04/03/24 0604 04/04/24 0645 04/05/24 0558  WBC 28.5* 22.5* 13.9* 11.0*   Microbiology Recent Results (from the past 240 hours)  Resp panel by RT-PCR (RSV, Flu A&B, Covid) Anterior Nasal Swab     Status: None   Collection Time: 04/01/24  5:35 PM   Specimen: Anterior Nasal Swab  Result Value Ref Range Status   SARS Coronavirus 2 by RT PCR NEGATIVE NEGATIVE Final    Comment: (NOTE) SARS-CoV-2 target nucleic acids are NOT DETECTED.  The SARS-CoV-2 RNA is generally detectable in upper respiratory specimens during the acute phase of infection. The lowest concentration of SARS-CoV-2 viral copies this assay can detect is 138 copies/mL. A negative result does not preclude SARS-Cov-2 infection and should not be used as the sole basis for treatment or other patient management decisions. A negative result may occur with  improper specimen collection/handling, submission of specimen other than nasopharyngeal swab, presence of viral mutation(s) within the areas targeted by this assay, and inadequate number of viral copies(<138 copies/mL). A negative result must be combined with clinical observations, patient history, and epidemiological information. The expected result is Negative.  Fact Sheet for Patients:  bloggercourse.com  Fact Sheet for Healthcare Providers:  seriousbroker.it  This test is no t yet approved or cleared by the [place]  FDA and  has been authorized for detection and/or diagnosis of SARS-CoV-2 by FDA under an Emergency Use Authorization (EUA). This EUA will remain  in effect (meaning this test can be used) for the duration of the COVID-19 declaration under Section 564(b)(1) of the Act,  21 U.S.C.section 360bbb-3(b)(1), unless the  authorization is terminated  or revoked sooner.       Influenza A by PCR NEGATIVE NEGATIVE Final   Influenza B by PCR NEGATIVE NEGATIVE Final    Comment: (NOTE) The Xpert Xpress SARS-CoV-2/FLU/RSV plus assay is intended as an aid in the diagnosis of influenza from Nasopharyngeal swab specimens and should not be used as a sole basis for treatment. Nasal washings and aspirates are unacceptable for Xpert Xpress SARS-CoV-2/FLU/RSV testing.  Fact Sheet for Patients: bloggercourse.com  Fact Sheet for Healthcare Providers: seriousbroker.it  This test is not yet approved or cleared by the [place]  FDA and has been authorized for detection and/or diagnosis of SARS-CoV-2 by FDA under an Emergency Use Authorization (EUA). This EUA will remain in effect (meaning this test can be used) for the duration of the COVID-19 declaration under Section 564(b)(1) of the Act, 21 U.S.C. section 360bbb-3(b)(1), unless the authorization is terminated or revoked.     Resp Syncytial Virus by PCR NEGATIVE NEGATIVE Final    Comment: (NOTE) Fact Sheet for Patients: bloggercourse.com  Fact Sheet for Healthcare Providers: seriousbroker.it  This test is not yet approved or cleared by the [place]  FDA and has been authorized for detection and/or diagnosis of SARS-CoV-2 by FDA under an Emergency Use Authorization (EUA). This EUA will remain in effect (meaning this test can be used) for the duration of the COVID-19 declaration under Section 564(b)(1) of the Act, 21 U.S.C. section 360bbb-3(b)(1), unless the authorization is terminated or revoked.  Performed at [facility], [address] [place], [place] [address]   Urine Culture     Status: Abnormal   Collection Time: 04/01/24  5:37 PM   Specimen: Urine, Clean Catch  Result Value Ref Range Status   Specimen Description    Final    URINE, CLEAN CATCH Performed at [facility], [address], [place], [place] [address]    Special Requests   Final    NONE Reflexed from [id number] Performed at [facility], [address], [place], [place] [address]    Culture >=100,000 COLONIES/mL KLEBSIELLA AEROGENES (A)  Final   Report Status 04/03/2024 FINAL  Final   Organism ID, Bacteria KLEBSIELLA AEROGENES (A)  Final      Susceptibility   Klebsiella aerogenes - MIC*    CEFEPIME  <=0.12 SENSITIVE Sensitive     ERTAPENEM <=0.12 SENSITIVE Sensitive     CEFTRIAXONE  <=0.25 SENSITIVE Sensitive     CIPROFLOXACIN <=0.06 SENSITIVE Sensitive     GENTAMICIN <=1 SENSITIVE Sensitive     NITROFURANTOIN 128 RESISTANT Resistant     TRIMETH/SULFA <=20 SENSITIVE Sensitive     PIP/TAZO Value in next row Sensitive      <=4 SENSITIVEThis is a modified FDA-approved test that has been validated and its performance characteristics determined by the reporting laboratory.  This laboratory is certified under the Clinical Laboratory Improvement Amendments CLIA as qualified to perform high complexity clinical laboratory testing.    MEROPENEM Value in next row Sensitive      <=4 SENSITIVEThis is a modified FDA-approved test that has been validated and its performance characteristics determined by the reporting laboratory.  This laboratory is certified under the Clinical Laboratory Improvement Amendments CLIA as qualified to perform high complexity clinical laboratory testing.    * >=100,000 COLONIES/mL KLEBSIELLA AEROGENES  Blood culture (routine x 2)     Status: None (Preliminary result)   Collection Time: 04/01/24  6:14 PM   Specimen: BLOOD  RIGHT ARM  Result Value Ref Range Status   Specimen Description   Final    BLOOD RIGHT ARM Performed at [facility], [address] [place], [place] [address]    Special Requests   Final    BOTTLES DRAWN AEROBIC AND ANAEROBIC Blood Culture adequate  volume Performed at [facility], [address] [place], [place] [address]    Culture   Final    NO GROWTH 4 DAYS Performed at [facility], [address] [place], [place] [address]    Report Status PENDING  Incomplete  Blood culture (routine x 2)     Status: None (Preliminary result)   Collection Time: 04/01/24  6:28 PM   Specimen: BLOOD RIGHT HAND  Result Value Ref Range Status   Specimen Description   Final    BLOOD RIGHT HAND Performed at [facility], [address] [place], [place] [address]    Special Requests   Final    BOTTLES DRAWN AEROBIC AND ANAEROBIC Blood Culture adequate volume Performed at [facility], [address] [place], [place] [address]    Culture   Final    NO GROWTH 4 DAYS Performed at [facility], [address] [place], [place] [address]    Report Status PENDING  Incomplete    Please note: You were cared for by a hospitalist during your hospital stay. Once you are discharged, your primary care physician will handle any further medical issues. Please note that NO REFILLS for any discharge medications will be authorized once you are discharged, as it is imperative that you return to your primary care physician (or establish a relationship with a primary care physician if you do not have one) for your post hospital discharge needs so that they can reassess your need for medications and monitor your lab values.    Time coordinating discharge: 40 minutes  SIGNED:   Ivonne Mustache, MD  [facility] 04/05/2024, 1:25 PM Pager [phone]  If 7PM-7AM, please contact night-coverage [email or web address] Password [email or web address]     [1] No Known Allergies

## 2024-04-05 NOTE — Plan of Care (Signed)

## 2024-04-05 NOTE — TOC Initial Note (Addendum)
 Transition of Care [facility]) - Initial/Assessment Note    Patient Details  Name: Bruce Dixon MRN: [id number] Date of Birth: March 20, 1932  Transition of [facility]) CM/SW Contact:    Doneta Glenys DASEN, RN Phone Number: 04/05/2024, 11:24 AM  Clinical Narrative:    1:30 PM CM received call back from  [facility] that patient did not understand that he was still going to skilled nursing at [facility]. CM obtained PASRR - [id number] and completed FL2. Discharge summary and FL2 sent via HUB to [facility]. No insurance auth required -Medicare A&B. [facility] will be providing transport at discharge. 12:58 PM  CM received chat from patients nurse that [facility] would like me to her [phone] . CM called left message. 11:24 AM            CM spoke with patient in the room. PTA lives at [facility]. Patient politely refused SNF and HH. Patient states the [facility] [phone]) with transport at discharge. No additional needs identified doing visit.    Barriers to Discharge: Barriers Resolved   Patient Goals and CMS Choice Patient states their goals for this hospitalization and ongoing recovery are:: [facility] [employer].gov Compare Post Acute Care list provided to:: Patient Choice offered to / list presented to : Patient [employer] ownership interest in [facility].provided to:: Patient    Expected Discharge Plan and Services   Discharge Planning Services: NA Post Acute Care Choice: NA Living arrangements for the past 2 months: Assisted Living Facility                 DME Arranged: N/A DME Agency: NA       HH Arranged: Refused SNF, Refused HH HH Agency: NA        Prior Living Arrangements/Services Living arrangements for the past 2 months: Assisted Living Facility Lives with:: Facility Resident Patient language and need for interpreter reviewed:: Yes Do you feel safe going back to the place where you live?:  Yes      Need for Family Participation in Patient Care: No (Comment) Care giver support system in place?: Yes (comment) Current home services:  (NA) Criminal Activity/Legal Involvement Pertinent to Current Situation/Hospitalization: No - Comment as needed  Activities of Daily Living      Permission Sought/Granted Permission sought to share information with : Case Manager Permission granted to share information with : Yes, Verbal Permission Granted     Permission granted to share info w AGENCY: [facility]        Emotional Assessment Appearance:: Appears stated age Attitude/Demeanor/Rapport: Engaged Affect (typically observed): Appropriate Orientation: : Oriented to Self, Oriented to Place, Oriented to  Time, Oriented to Situation Alcohol / Substance Use: Not Applicable Psych Involvement: No (comment)  Admission diagnosis:  Bacteremia [R78.81] Acute cystitis without hematuria [N30.00] Sepsis secondary to UTI (HCC) [A41.9, N39.0] Sepsis (HCC) [A41.9] Patient Active Problem List   Diagnosis Date Noted   Sepsis secondary to UTI (HCC) 04/02/2024   Sepsis (HCC) 04/01/2024   Skin rash 08/21/2010   Vitamin D  deficiency 01/23/2010   ELECTROCARDIOGRAM, ABNORMAL 01/20/2009   Hyperlipidemia 01/21/2008   Essential hypertension 01/21/2008   Idiopathic scoliosis and kyphoscoliosis 01/21/2008   Morbid obesity (HCC) 01/21/2008   Hx of colonic polyps 01/21/2008   PCP:  Darlean Ozell NOVAK, MD Pharmacy:   [facility] - [place], [place] - [address] [place] [address] Phone: [phone] Fax: [phone]  Social Drivers of Health (SDOH) Social History: SDOH Screenings   Food Insecurity: No Food Insecurity (04/03/2024)  Housing: Low Risk (04/03/2024)  Transportation Needs: No Transportation Needs (04/03/2024)  Utilities: Not At Risk (04/03/2024)  Social Connections: Unknown (04/03/2024)  Tobacco Use: Low Risk (04/01/2024)   SDOH  Interventions:     Readmission Risk Interventions    04/05/2024   11:21 AM  Readmission Risk Prevention Plan  Post Dischage Appt Complete  Medication Screening Complete  Transportation Screening Complete

## 2024-04-05 NOTE — Evaluation (Signed)
 Occupational Therapy Evaluation Patient Details Name: Bruce Dixon MRN: [id number] DOB: [birth year] Today's Date: 04/05/2024   History of Present Illness   Bruce Dixon is a 89 y.o. male  who presented to ED on 04/01/2024 with progressive weakness resulting in a fall at home, admitted for  sepsis secondary to UTI. PMH: HTN, HLD, hypothyroidism, vitamin B12 deficiency, osteoarthritis     Clinical Impressions Pt is typically independent and active. He resides at [facility] and receives assistance for meal prep, housekeeping. Presents with mild unsteadiness and benefits from use of RW with hallway ambulation. He is not quite confident in his walking without AD. Pt overall requires set up to supervision for ADLs. Patient will benefit from continued inpatient follow up therapy, <3 hours/day as pt lives alone and is not at his baseline independence. Will defer further OT to SNF.     If plan is discharge home, recommend the following:   A little help with walking and/or transfers;A little help with bathing/dressing/bathroom;Assistance with cooking/housework     Functional Status Assessment   Patient has had a recent decline in their functional status and demonstrates the ability to make significant improvements in function in a reasonable and predictable amount of time.     Equipment Recommendations   None recommended by OT     Recommendations for Other Services         Precautions/Restrictions   Precautions Precautions: Fall Recall of Precautions/Restrictions: Intact Restrictions Weight Bearing Restrictions Per Provider Order: No     Mobility Bed Mobility Overal bed mobility: Modified Independent                  Transfers Overall transfer level: Needs assistance Equipment used: Rolling walker (2 wheels) Transfers: Sit to/from Stand Sit to Stand: Supervision           General transfer comment: attempting to pull up on rolling table       Balance     Sitting balance-Leahy Scale: Normal       Standing balance-Leahy Scale: Fair                             ADL either performed or assessed with clinical judgement   ADL                                         General ADL Comments: set up to supervision     Vision Baseline Vision/History: 1 Wears glasses Ability to See in Adequate Light: 0 Adequate Patient Visual Report: No change from baseline       Perception         Praxis         Pertinent Vitals/Pain Pain Assessment Pain Assessment: No/denies pain     Extremity/Trunk Assessment Upper Extremity Assessment Upper Extremity Assessment: Overall WFL for tasks assessed   Lower Extremity Assessment Lower Extremity Assessment: Defer to PT evaluation   Cervical / Trunk Assessment Cervical / Trunk Assessment: Other exceptions (scoliosis)   Communication Communication Communication: No apparent difficulties   Cognition Arousal: Alert Behavior During Therapy: WFL for tasks assessed/performed Cognition: No apparent impairments                               Following commands: Intact  Cueing  General Comments   Cueing Techniques: Verbal cues      Exercises     Shoulder Instructions      Home Living Family/patient expects to be discharged to:: Private residence Living Arrangements: Alone   Type of Home: Independent living facility Home Access: Level entry;Elevator     Home Layout: One level     Bathroom Shower/Tub: Producer, Television/film/video: Standard     Home Equipment: Grab bars - toilet;Grab bars - tub/shower   Additional Comments: has access to a lot of DME at the facility      Prior Functioning/Environment Prior Level of Function : Independent/Modified Independent;Driving             Mobility Comments: reports that he walks briskly all over the facility and property      OT Problem List: Impaired balance  (sitting and/or standing)   OT Treatment/Interventions:        OT Goals(Current goals can be found in the care plan section)       OT Frequency:       Co-evaluation              AM-PAC OT 6 Clicks Daily Activity     Outcome Measure Help from another person eating meals?: None Help from another person taking care of personal grooming?: A Little Help from another person toileting, which includes using toliet, bedpan, or urinal?: A Little Help from another person bathing (including washing, rinsing, drying)?: A Little Help from another person to put on and taking off regular upper body clothing?: A Little Help from another person to put on and taking off regular lower body clothing?: A Little 6 Click Score: 19   End of Session Equipment Utilized During Treatment: Rolling walker (2 wheels);Gait belt  Activity Tolerance: Patient tolerated treatment well Patient left: in chair;with call bell/phone within reach  OT Visit Diagnosis: Unsteadiness on feet (R26.81)                Time: 8965-8946 OT Time Calculation (min): 19 min Charges:  OT General Charges $OT Visit: 1 Visit OT Evaluation $OT Eval Low Complexity: 1 Low Mliss HERO, OTR/L Acute Rehabilitation Services Office: [phone]   Kennth Mliss Helling 04/05/2024, 11:00 AM

## 2024-04-06 LAB — CULTURE, BLOOD (ROUTINE X 2)
Culture: NO GROWTH
Culture: NO GROWTH
Special Requests: ADEQUATE
Special Requests: ADEQUATE
